# Patient Record
Sex: Female | Born: 1955 | Race: Asian | Hispanic: No | Marital: Married | Smoking: Never smoker
Health system: Southern US, Community
[De-identification: ages and names within clinical notes are randomized; demographics above are authoritative.]

## PROBLEM LIST (undated history)

## (undated) DIAGNOSIS — E119 Type 2 diabetes mellitus without complications: Secondary | ICD-10-CM

## (undated) DIAGNOSIS — I10 Essential (primary) hypertension: Secondary | ICD-10-CM

---

## 2010-07-20 ENCOUNTER — Ambulatory Visit: Admit: 2010-07-20 | Payer: Self-pay | Admitting: Internal Medicine

## 2010-08-01 ENCOUNTER — Inpatient Hospital Stay (INDEPENDENT_AMBULATORY_CARE_PROVIDER_SITE_OTHER)
Admission: RE | Admit: 2010-08-01 | Discharge: 2010-08-01 | Disposition: A | Discharge status: Home / Self Care | Payer: Self-pay | Source: Ambulatory Visit | Attending: Family Medicine | Admitting: Family Medicine

## 2010-08-01 DIAGNOSIS — I10 Essential (primary) hypertension: Secondary | ICD-10-CM

## 2010-08-01 DIAGNOSIS — R51 Headache: Secondary | ICD-10-CM

## 2010-10-18 ENCOUNTER — Inpatient Hospital Stay (INDEPENDENT_AMBULATORY_CARE_PROVIDER_SITE_OTHER)
Admission: RE | Admit: 2010-10-18 | Discharge: 2010-10-18 | Disposition: A | Discharge status: Home / Self Care | Payer: Medicaid Other | Source: Ambulatory Visit | Attending: Family Medicine | Admitting: Family Medicine

## 2010-10-18 DIAGNOSIS — I10 Essential (primary) hypertension: Secondary | ICD-10-CM

## 2010-10-18 LAB — POCT I-STAT, CHEM 8
BUN: 8 mg/dL (ref 6–23)
Chloride: 103 mEq/L (ref 96–112)
Potassium: 4 mEq/L (ref 3.5–5.1)
Sodium: 141 mEq/L (ref 135–145)
TCO2: 29 mmol/L (ref 0–100)

## 2010-12-05 ENCOUNTER — Other Ambulatory Visit (HOSPITAL_COMMUNITY): Payer: Self-pay | Admitting: Family Medicine

## 2010-12-05 DIAGNOSIS — R1011 Right upper quadrant pain: Secondary | ICD-10-CM

## 2010-12-12 ENCOUNTER — Other Ambulatory Visit (HOSPITAL_COMMUNITY): Payer: Medicaid Other

## 2011-03-05 ENCOUNTER — Other Ambulatory Visit (HOSPITAL_COMMUNITY): Payer: Self-pay | Admitting: Family Medicine

## 2011-03-05 DIAGNOSIS — R109 Unspecified abdominal pain: Secondary | ICD-10-CM

## 2011-03-11 ENCOUNTER — Other Ambulatory Visit (HOSPITAL_COMMUNITY): Payer: Self-pay

## 2011-06-04 ENCOUNTER — Emergency Department (INDEPENDENT_AMBULATORY_CARE_PROVIDER_SITE_OTHER)
Admission: EM | Admit: 2011-06-04 | Discharge: 2011-06-04 | Disposition: A | Discharge status: Home / Self Care | Payer: Medicaid Other | Source: Home / Self Care | Attending: Emergency Medicine | Admitting: Emergency Medicine

## 2011-06-04 ENCOUNTER — Encounter: Payer: Self-pay | Admitting: Emergency Medicine

## 2011-06-04 DIAGNOSIS — I10 Essential (primary) hypertension: Secondary | ICD-10-CM

## 2011-06-04 DIAGNOSIS — E876 Hypokalemia: Secondary | ICD-10-CM

## 2011-06-04 DIAGNOSIS — D239 Other benign neoplasm of skin, unspecified: Secondary | ICD-10-CM

## 2011-06-04 HISTORY — DX: Essential (primary) hypertension: I10

## 2011-06-04 LAB — POCT I-STAT, CHEM 8
Creatinine, Ser: 0.7 mg/dL (ref 0.50–1.10)
Hemoglobin: 13.6 g/dL (ref 12.0–15.0)
Potassium: 3.4 mEq/L — ABNORMAL LOW (ref 3.5–5.1)
Sodium: 142 mEq/L (ref 135–145)

## 2011-06-04 MED ORDER — LISINOPRIL-HYDROCHLOROTHIAZIDE 20-12.5 MG PO TABS: 1.0000 | ORAL_TABLET | Freq: Every day | ORAL | Status: DC

## 2011-06-04 MED ORDER — POTASSIUM CHLORIDE ER 10 MEQ PO TBCR: 10.0000 meq | EXTENDED_RELEASE_TABLET | Freq: Two times a day (BID) | ORAL | Status: DC

## 2011-06-04 NOTE — ED Provider Notes (Signed)
History     CSN: [id number] Arrival date & time: 06/04/2011  3:01 PM   First MD Initiated Contact with Patient 06/04/11 1438      Chief Complaint  Patient presents with  . Hypertension    (Consider location/radiation/quality/duration/timing/severity/associated sxs/prior treatment) HPI Comments: The patient speaks very little English and the history is provided by her daughter who speaks a fair amount of [place]. She has had a 16 year history of hypertension, initially treated in [place]. She was on medication there, although she can't provide the name of it. Since she's been here in [place], she goes to the emergency room to get her blood pressure meds refilled. She's been on lisinopril/HCTZ 20/12.5 once a day. She ran out about a week ago and since then has had some headache but no other symptoms. She denies any blurry vision, shortness of breath, chest pain, tightness, pressure, palpitations, dizziness, or syncope. She has had no known history of heart disease, diabetes, elevated cholesterol, stroke, or kidney disease.  Patient is a 55 y.o. female presenting with hypertension.  Hypertension Pertinent negatives include no chest pain, no headaches and no shortness of breath.    Past Medical History  Diagnosis Date  . Hypertension     History reviewed. No pertinent past surgical history.  History reviewed. No pertinent family history.  History  Substance Use Topics  . Smoking status: Never Smoker   . Smokeless tobacco: Not on file  . Alcohol Use: No    OB History    Grav Para Term Preterm Abortions TAB SAB Ect Mult Living                  Review of Systems  Respiratory: Negative for cough, chest tightness, shortness of breath and wheezing.   Cardiovascular: Negative for chest pain, palpitations and leg swelling.  Neurological: Negative for dizziness, weakness, light-headedness, numbness and headaches.    Allergies  Review of patient's allergies indicates no known  allergies.  Home Medications   Current Outpatient Rx  Name Route Sig Dispense Refill  . LISINOPRIL-HYDROCHLOROTHIAZIDE 20-12.5 MG PO TABS Oral Take 1 tablet by mouth daily.      Marland Kitchen LISINOPRIL-HYDROCHLOROTHIAZIDE 20-12.5 MG PO TABS Oral Take 1 tablet by mouth daily. 30 tablet 0  . POTASSIUM CHLORIDE CR 10 MEQ PO TBCR Oral Take 1 tablet (10 mEq total) by mouth 2 (two) times daily. 30 tablet 0    BP 130/90  Pulse 83  Temp(Src) 98.3 F (36.8 C) (Oral)  Resp 20  SpO2 97%  Physical Exam  Nursing note and vitals reviewed. Constitutional: She appears well-developed and well-nourished. No distress.  Neck: No JVD present.  Cardiovascular: Normal rate, regular rhythm, normal heart sounds and intact distal pulses.  Exam reveals no gallop and no friction rub.   No murmur heard. Pulmonary/Chest: Effort normal and breath sounds normal. No respiratory distress. She has no wheezes. She has no rales.  Abdominal: Soft. Bowel sounds are normal. She exhibits no distension, no abdominal bruit, no pulsatile midline mass and no mass. There is no hepatosplenomegaly. There is no tenderness. There is no rebound and no guarding.  Musculoskeletal: She exhibits no edema.  Skin: She is not diaphoretic.    ED Course  Procedures (including critical care time)  Results for orders placed during the hospital encounter of 06/04/11  POCT I-STAT, CHEM 8      Component Value Range   Sodium 142  135 - 145 (mEq/L)   Potassium 3.4 (*) 3.5 - 5.1 (mEq/L)  Chloride 104  96 - 112 (mEq/L)   BUN 7  6 - 23 (mg/dL)   Creatinine, Ser 1.61  0.50 - 1.10 (mg/dL)   Glucose, Bld 096 (*) 70 - 99 (mg/dL)   Calcium, Ion 0.45  4.09 - 1.32 (mmol/L)   TCO2 30  0 - 100 (mmol/L)   Hemoglobin 13.6  12.0 - 15.0 (g/dL)   HCT 81.1  91.4 - 78.2 (%)     Labs Reviewed  POCT I-STAT, CHEM 8 - Abnormal; Notable for the following:    Potassium 3.4 (*)    Glucose, Bld 126 (*)    All other components within normal limits   No results  found.   1. Hypertension   2. Hypokalemia       MDM  Her blood pressure right now is okay, she is a little bit hypokalemic. She was given a refill on her lisinopril/HCTZ and also some potassium chloride. I tried to impress upon her and her daughter the importance of finding a primary care physician that she can go to get refills of her blood pressure medication.        Roque Lias, MD 06/04/11 [phone]

## 2011-06-04 NOTE — ED Notes (Signed)
Patient finished blood pressure medicine, requesting refill, patient has no pcp

## 2011-08-14 ENCOUNTER — Inpatient Hospital Stay (HOSPITAL_COMMUNITY)
Admission: EM | Admit: 2011-08-14 | Discharge: 2011-08-18 | DRG: 390 | Disposition: A | Discharge status: Home / Self Care | Payer: Medicaid Other | Attending: Internal Medicine | Admitting: Internal Medicine

## 2011-08-14 ENCOUNTER — Other Ambulatory Visit: Payer: Self-pay

## 2011-08-14 ENCOUNTER — Emergency Department (HOSPITAL_COMMUNITY)
Admission: EM | Admit: 2011-08-14 | Discharge: 2011-08-14 | Disposition: A | Discharge status: Nursing Home (Includes ICF) | Payer: Medicaid Other | Source: Home / Self Care

## 2011-08-14 ENCOUNTER — Encounter (HOSPITAL_COMMUNITY): Payer: Self-pay | Admitting: *Deleted

## 2011-08-14 ENCOUNTER — Emergency Department (HOSPITAL_COMMUNITY): Payer: Medicaid Other

## 2011-08-14 DIAGNOSIS — E876 Hypokalemia: Secondary | ICD-10-CM | POA: Diagnosis present

## 2011-08-14 DIAGNOSIS — K567 Ileus, unspecified: Secondary | ICD-10-CM | POA: Diagnosis present

## 2011-08-14 DIAGNOSIS — E669 Obesity, unspecified: Secondary | ICD-10-CM | POA: Diagnosis present

## 2011-08-14 DIAGNOSIS — R1013 Epigastric pain: Secondary | ICD-10-CM

## 2011-08-14 DIAGNOSIS — K560 Paralytic ileus: Secondary | ICD-10-CM | POA: Diagnosis present

## 2011-08-14 DIAGNOSIS — K56609 Unspecified intestinal obstruction, unspecified as to partial versus complete obstruction: Secondary | ICD-10-CM

## 2011-08-14 DIAGNOSIS — I10 Essential (primary) hypertension: Secondary | ICD-10-CM

## 2011-08-14 LAB — DIFFERENTIAL
Lymphs Abs: 4.4 10*3/uL — ABNORMAL HIGH (ref 0.7–4.0)
Monocytes Relative: 5 % (ref 3–12)
Neutro Abs: 9.9 10*3/uL — ABNORMAL HIGH (ref 1.7–7.7)
Neutrophils Relative %: 65 % (ref 43–77)

## 2011-08-14 LAB — CBC
Hemoglobin: 13.8 g/dL (ref 12.0–15.0)
MCH: 29 pg (ref 26.0–34.0)
RBC: 4.76 MIL/uL (ref 3.87–5.11)

## 2011-08-14 LAB — COMPREHENSIVE METABOLIC PANEL
Alkaline Phosphatase: 92 U/L (ref 39–117)
BUN: 9 mg/dL (ref 6–23)
CO2: 28 mEq/L (ref 19–32)
Chloride: 102 mEq/L (ref 96–112)
GFR calc Af Amer: >90 mL/min (ref >90)
Glucose, Bld: 116 mg/dL — ABNORMAL HIGH (ref 70–99)
Potassium: 3.4 mEq/L — ABNORMAL LOW (ref 3.5–5.1)
Total Bilirubin: 0.2 mg/dL — ABNORMAL LOW (ref 0.3–1.2)

## 2011-08-14 LAB — LIPASE, BLOOD: Lipase: 35 U/L (ref 11–59)

## 2011-08-14 MED ORDER — IOHEXOL 300 MG/ML  SOLN
40.0000 mL | Freq: Once | INTRAMUSCULAR | Status: AC | PRN
  Administered 2011-08-14: 40 mL via ORAL

## 2011-08-14 MED ORDER — SODIUM CHLORIDE 0.9 % IV BOLUS (SEPSIS)
1000.0000 mL | Freq: Once | INTRAVENOUS | Status: AC
  Administered 2011-08-14: 1000 mL via INTRAVENOUS

## 2011-08-14 MED ORDER — MORPHINE SULFATE 4 MG/ML IJ SOLN
4.0000 mg | Freq: Once | INTRAMUSCULAR | Status: AC
  Administered 2011-08-14: 4 mg via INTRAVENOUS
  Filled 2011-08-14: qty 1

## 2011-08-14 MED ORDER — ONDANSETRON HCL 4 MG/2ML IJ SOLN
4.0000 mg | Freq: Once | INTRAMUSCULAR | Status: AC
  Administered 2011-08-14: 4 mg via INTRAVENOUS
  Filled 2011-08-14: qty 2

## 2011-08-14 MED ORDER — PROMETHAZINE HCL 25 MG/ML IJ SOLN
25.0000 mg | Freq: Once | INTRAMUSCULAR | Status: AC
  Administered 2011-08-14: 25 mg via INTRAVENOUS
  Filled 2011-08-14: qty 1

## 2011-08-14 MED ORDER — IOHEXOL 300 MG/ML  SOLN
100.0000 mL | Freq: Once | INTRAMUSCULAR | Status: AC | PRN
  Administered 2011-08-14: 100 mL via INTRAVENOUS

## 2011-08-14 NOTE — ED Provider Notes (Signed)
Medical screening examination/treatment/procedure(s) were performed by resident physician and as supervising physician I was immediately available for consultation/collaboration.  Richardo Priest, MD  Richardo Priest, MD 08/14/11 2213

## 2011-08-14 NOTE — ED Notes (Signed)
Pt. Transferred to stretcher without incident, pt. Non- English speaking, NAD noted, EDP at the bedside, IV right a/c in place, NS KVO infusing

## 2011-08-14 NOTE — ED Notes (Signed)
Pt drinking contrast for CT scan.

## 2011-08-14 NOTE — ED Provider Notes (Signed)
History     CSN: [id number]  Arrival date & time 08/14/11  1191   None     Chief Complaint  Patient presents with  . Abdominal Pain    (Consider location/radiation/quality/duration/timing/severity/associated sxs/prior treatment) HPI History provided by the patient and the patient's son. The son used as a Nurse, learning disability secondary to patient's language is [place].  History limited secondary to poor historian and mild language barrier.  56 year old female with history of hypertension and from urgent care by EMS secondary to epigastric pain. Patient's pain reportedly began around 3:30 today and is mostly located in the epigastrium. Patient's pain is described as sharp and knifelike, is waxing and waning, described as severe, and although initially stating there is no radiation, patient states that she does have mild pain in her back which appears to be associated. Patient denies associated nausea, vomiting, diarrhea, hematochezia, or urinary symptoms. Patient has not had recent fever sweats or chills.  Patient does report mild worsening of her pain with movement and cannot appreciate any alleviating factors.  The patient's pain did occur soon after eating the uterus.  Patient is not have a history of similar symptoms in the past. Patient has not had abdominal surgeries in the past.  Patient was seen prior to ED arrival at urgent care where an IV was placed but no labs or further evaluation was completed   Past Medical History  Diagnosis Date  . Hypertension     No past surgical history on file.  No family history on file.  History  Substance Use Topics  . Smoking status: Never Smoker   . Smokeless tobacco: Not on file  . Alcohol Use: No    OB History    Grav Para Term Preterm Abortions TAB SAB Ect Mult Living                  Review of Systems  Constitutional: Negative for fever and chills.  HENT: Negative for congestion, sore throat and rhinorrhea.   Eyes: Negative for pain  and visual disturbance.  Respiratory: Negative for cough, shortness of breath and wheezing.   Cardiovascular: Negative for chest pain and palpitations.  Gastrointestinal: Positive for abdominal pain. Negative for nausea, vomiting, diarrhea and blood in stool.  Genitourinary: Negative for dysuria and hematuria.  Musculoskeletal: Positive for back pain. Negative for gait problem.  Skin: Negative for rash and wound.  Neurological: Negative for dizziness and headaches.  Psychiatric/Behavioral: Negative for confusion and agitation.  All other systems reviewed and are negative.    Allergies  Review of patient's allergies indicates no known allergies.  Home Medications   Current Outpatient Rx  Name Route Sig Dispense Refill  . LISINOPRIL-HYDROCHLOROTHIAZIDE 20-12.5 MG PO TABS Oral Take 1 tablet by mouth daily.      Marland Kitchen LISINOPRIL-HYDROCHLOROTHIAZIDE 20-12.5 MG PO TABS Oral Take 1 tablet by mouth daily. 30 tablet 0  . POTASSIUM CHLORIDE ER 10 MEQ PO TBCR Oral Take 1 tablet (10 mEq total) by mouth 2 (two) times daily. 30 tablet 0    BP 127/91  Pulse 101  Temp(Src) 98.4 F (36.9 C) (Oral)  Resp 16  SpO2 95%  Physical Exam  Nursing note and vitals reviewed. Constitutional: She is oriented to person, place, and time.       Appears uncomfortable but NAD, mildly obese, alert, son arriving soon after ED bedding  HENT:  Head: Normocephalic and atraumatic.  Right Ear: External ear normal.  Left Ear: External ear normal.  Nose: Nose normal.  Mouth/Throat: Oropharynx is clear and moist.  Eyes: Conjunctivae and EOM are normal. Pupils are equal, round, and reactive to light.  Neck: Normal range of motion. Neck supple.  Cardiovascular: Regular rhythm and intact distal pulses.  Tachycardia present.   No murmur heard. Pulmonary/Chest: Effort normal and breath sounds normal. No respiratory distress. She has no wheezes. She has no rales. She exhibits no tenderness.  Abdominal: Soft. Bowel sounds  are normal. There is tenderness (diffusely but greater in the epigastrium). There is no rebound and no guarding.       Obese, neg murphy's Sn, positive bilateral CVA TTP  Musculoskeletal: Normal range of motion. She exhibits no edema.  Neurological: She is alert and oriented to person, place, and time.  Skin: Skin is warm and dry. No rash noted. She is not diaphoretic.  Psychiatric: She has a normal mood and affect. Judgment normal.    ED Course  Procedures (including critical care time)   Date: 08/14/2011  Rate: 99  Rhythm: normal sinus rhythm  QRS Axis: normal  Intervals: normal  ST/T Wave abnormalities: normal  Conduction Disutrbances: none Early R progression No prior.    Labs Reviewed  CBC - Abnormal; Notable for the following:    WBC 15.2 (*)    All other components within normal limits  DIFFERENTIAL - Abnormal; Notable for the following:    Neutro Abs 9.9 (*)    Lymphs Abs 4.4 (*)    All other components within normal limits  COMPREHENSIVE METABOLIC PANEL - Abnormal; Notable for the following:    Potassium 3.4 (*)    Glucose, Bld 116 (*)    Total Bilirubin 0.2 (*)    All other components within normal limits  LIPASE, BLOOD  LACTIC ACID, PLASMA   Ct Abdomen Pelvis W Contrast  08/14/2011  *RADIOLOGY REPORT*  Clinical Data: Mid to lower abdominal pain.  Nausea and vomiting. Fever.  CT ABDOMEN AND PELVIS WITH CONTRAST  Technique:  Multidetector CT imaging of the abdomen and pelvis was performed following the standard protocol during bolus administration of intravenous contrast.  Contrast: OMNIPAQUE IOHEXOL 300 MG/ML IV SOLN  Comparison: None.  Findings: Mild diffuse low density of the liver relative to the spleen. Small cyst in the posterior aspect of the dome of the liver.  Additional small cyst or a nonspecific area of low density in the anterior aspect of the upper liver on the right, measuring 4 mm in maximum diameter on image #9.  Right iliac bone cyst.  Mildly  dilated, fluid-filled small bowel loops in the lower abdomen and pelvis with normal caliber proximal and distal loops.  Minimal free peritoneal fluid in the pelvic cul-de-sac.  No bowel wall thickening or soft tissue stranding.  No enlarged lymph nodes.  Unremarkable spleen, pancreas, gallbladder, adrenal glands, kidneys, urinary bladder, uterus and ovaries.  Normal appearing appendix in the right upper pelvis.  Clear lung bases.  Mild lower thoracic spine degenerative changes.  IMPRESSION:  1.  Mildly dilated, fluid-filled small bowel loops in the lower abdomen and pelvis.  Differential considerations include ileus and mild partial obstruction. 2.  Small amount of probable physiological fluid in the pelvic cul- de-sac. 3.  Mild hepatic steatosis.  Original Report Authenticated By: Darrol Angel, M.D.     1. Epigastric abdominal pain   2. Ileus       MDM  56 year old female with complaint of acute epigastric pain which is waxing and waning worse with movement.  Exam as above, borderline  tachycardia, diffuse abdominal tenderness to palpation greater in the epigastrium. Labs, morphine, Zofran, and CT abdomen ordered.  Patient vomiting x 4 during ED course, possible 2/2 abd path vs. Morphine.  CT concerning for distended small bowel without transition point concerning for ileus versus partial SBO.  Patient without history of prior surgeries and no radiographic evidence of tumor or hernia; thus, ileus most likely.  [facility] consult and will admit the patient. Discussed case with surgery who recommends n.p.o., IVF resuscitation, anti-emetics, and no NG tube unless with persistent vomiting.    Patient admitted to [facility].  Particia Lather, MD 08/15/11 [phone]

## 2011-08-14 NOTE — ED Notes (Signed)
Pt vomiting, EDP resident notified and Zofran ordered.  Family remains at bedside.

## 2011-08-14 NOTE — ED Notes (Signed)
Pt  Reports  abd  Pain   For  One  Hour         Nausea    -  No  Vomiting  No  Diarrhea    -  Normal  bm  Yesterday      -            No  Medical  Problems             Other  Than  hypertension

## 2011-08-14 NOTE — ED Notes (Signed)
PT  MOANING  AT  INTERVALS   WITH  SEVERE  EPIGASTRIC  PAIN           SON  AT  BEDSIDE    SKIN IS  WARM  /  DRY     -  PT  IS  Npo   At  This  Time

## 2011-08-14 NOTE — ED Provider Notes (Signed)
[name] [name] is a 56 y.o. female who presents to Urgent Care today for abdominal pain. Her son is able to provide translation. She reports that she had abdominal pain starting about one hour ago that was severe in intensity immediately. She last had something to eat at noon today. She was in usual state of health until one hour prior to presentation. Describes mostly epigastric pain that waxes and wanes in intensity. Denies any nausea, vomiting, diarrhea. She did have a normal bowel movement yesterday. No fevers or chills. Pain is 10 out of 10. She denies any history of abdominal surgery. She has never had any abdominal pain like this before the past.   PMH reviewed.  ROS as above otherwise neg Medications reviewed. No current facility-administered medications for this encounter.   Current Outpatient Prescriptions  Medication Sig Dispense Refill  . lisinopril-hydrochlorothiazide (PRINZIDE,ZESTORETIC) 20-12.5 MG per tablet Take 1 tablet by mouth daily.        Marland Kitchen lisinopril-hydrochlorothiazide (ZESTORETIC) 20-12.5 MG per tablet Take 1 tablet by mouth daily.  30 tablet  0  . potassium chloride (K-DUR) 10 MEQ tablet Take 1 tablet (10 mEq total) by mouth 2 (two) times daily.  30 tablet  0    Exam:  BP 154/101  Pulse 108  Temp(Src) 97.7 F (36.5 C) (Oral)  Resp 22  SpO2 100% Gen: Patient lying on hospital bed in moderate distress, moaning in pain. HEENT: EOMI,  MMM Lungs: CTABL Nl WOB Heart: RRR no MRG Abd: Soft. Distended and tender throughout abdomen but point of maximal tenderness is epigastric area. Hypoactive bowel sounds. Guarding present.  No rebound tenderness Exts: Non edematous BL  LE, warm and well perfused.   Assessment and Plan:  1.  Abdominal pain: Plan to transfer patient directly to emergency room via [facility]. Concern is that she has acute abdomen. She is tender throughout her entire abdomen and had immediate abdominal pain at maximum intensity one hour prior to  presentation. She is writhing on hospital bed. She has hypoactive bowel sounds. Differential includes acute cholecystitis, pancreatitis, referred pain from other abdominal process.  Renold Don, MD 08/14/11 [phone]

## 2011-08-14 NOTE — H&P (Signed)
[name] [name] is an 56 y.o. female.   PCP - [facility]. Obtained history using an interpreter as patient speaks only [place]. Chief Complaint: Abdominal pain with nausea and vomiting. HPI: 56 year-old female with history of hypertension and no previous surgeries presented to the ER because of persistent abdominal pain. The pain is localized around the epigastric area has been constant. The pain started off suddenly around 4 PM this evening. Once patient came to the ER patient had thrown up twice there was no blood in it. The last time she had a bowel movement was today morning. It was normal and did not have any diarrhea. Patient has not had similar symptoms before. CAT scan of the abdomen and pelvis done shows small bowel obstruction versus ileus. Surgery was consulted by the ER physician at this time hospitalist will be admitting for further management. Patient denies any chest pain shorter breath fever chills cough or phlegm dizziness or any focal deficits.  Past Medical History  Diagnosis Date  . Hypertension     History reviewed. No pertinent past surgical history.  History reviewed. No pertinent family history. Social History:  reports that she has never smoked. She does not have any smokeless tobacco history on file. She reports that she does not drink alcohol or use illicit drugs.  Allergies: No Known Allergies  Medications Prior to Admission  Medication Dose Route Frequency Provider Last Rate Last Dose  . iohexol (OMNIPAQUE) 300 MG/ML solution 100 mL  100 mL Intravenous Once PRN Medication Radiologist, MD   100 mL at 08/14/11 2121  . iohexol (OMNIPAQUE) 300 MG/ML solution 40 mL  40 mL Oral Once PRN Medication Radiologist, MD   40 mL at 08/14/11 2121  . morphine 4 MG/ML injection 4 mg  4 mg Intravenous Once Particia Lather, MD   4 mg at 08/14/11 1944  . morphine 4 MG/ML injection 4 mg  4 mg Intravenous Once Particia Lather, MD   4 mg at 08/14/11 2116  . ondansetron (ZOFRAN) injection 4 mg  4 mg  Intravenous Once Particia Lather, MD   4 mg at 08/14/11 1942  . ondansetron (ZOFRAN) injection 4 mg  4 mg Intravenous Once Particia Lather, MD   4 mg at 08/14/11 2116  . ondansetron (ZOFRAN) injection 4 mg  4 mg Intravenous Once Particia Lather, MD   4 mg at 08/14/11 2222  . promethazine (PHENERGAN) injection 25 mg  25 mg Intravenous Once Loren Racer, MD      . sodium chloride 0.9 % bolus 1,000 mL  1,000 mL Intravenous Once Particia Lather, MD   1,000 mL at 08/14/11 1944   Medications Prior to Admission  Medication Sig Dispense Refill  . lisinopril-hydrochlorothiazide (PRINZIDE,ZESTORETIC) 20-12.5 MG per tablet Take 1 tablet by mouth every morning.         Results for orders placed during the hospital encounter of 08/14/11 (from the past 48 hour(s))  CBC     Status: Abnormal   Collection Time   08/14/11  7:35 PM      Component Value Range Comment   WBC 15.2 (*) 4.0 - 10.5 (K/uL)    RBC 4.76  3.87 - 5.11 (MIL/uL)    Hemoglobin 13.8  12.0 - 15.0 (g/dL)    HCT 16.1  09.6 - 04.5 (%)    MCV 84.2  78.0 - 100.0 (fL)    MCH 29.0  26.0 - 34.0 (pg)    MCHC 34.4  30.0 - 36.0 (g/dL)    RDW 12.7  11.5 - 15.5 (%)    Platelets 282  150 - 400 (K/uL)   DIFFERENTIAL     Status: Abnormal   Collection Time   08/14/11  7:35 PM      Component Value Range Comment   Neutrophils Relative 65  43 - 77 (%)    Neutro Abs 9.9 (*) 1.7 - 7.7 (K/uL)    Lymphocytes Relative 29  12 - 46 (%)    Lymphs Abs 4.4 (*) 0.7 - 4.0 (K/uL)    Monocytes Relative 5  3 - 12 (%)    Monocytes Absolute 0.8  0.1 - 1.0 (K/uL)    Eosinophils Relative 1  0 - 5 (%)    Eosinophils Absolute 0.2  0.0 - 0.7 (K/uL)    Basophils Relative 0  0 - 1 (%)    Basophils Absolute 0.1  0.0 - 0.1 (K/uL)   COMPREHENSIVE METABOLIC PANEL     Status: Abnormal   Collection Time   08/14/11  7:35 PM      Component Value Range Comment   Sodium 140  135 - 145 (mEq/L)    Potassium 3.4 (*) 3.5 - 5.1 (mEq/L)    Chloride 102  96 - 112 (mEq/L)    CO2 28  19 - 32 (mEq/L)     Glucose, Bld 116 (*) 70 - 99 (mg/dL)    BUN 9  6 - 23 (mg/dL)    Creatinine, Ser 1.61  0.50 - 1.10 (mg/dL)    Calcium 9.8  8.4 - 10.5 (mg/dL)    Total Protein 7.7  6.0 - 8.3 (g/dL)    Albumin 3.9  3.5 - 5.2 (g/dL)    AST 28  0 - 37 (U/L)    ALT 25  0 - 35 (U/L)    Alkaline Phosphatase 92  39 - 117 (U/L)    Total Bilirubin 0.2 (*) 0.3 - 1.2 (mg/dL)    GFR calc non Af Amer >90  >90 (mL/min)    GFR calc Af Amer >90  >90 (mL/min)   LIPASE, BLOOD     Status: Normal   Collection Time   08/14/11  7:35 PM      Component Value Range Comment   Lipase 35  11 - 59 (U/L)   LACTIC ACID, PLASMA     Status: Normal   Collection Time   08/14/11  7:36 PM      Component Value Range Comment   Lactic Acid, Venous 2.0  0.5 - 2.2 (mmol/L)    Ct Abdomen Pelvis W Contrast  08/14/2011  *RADIOLOGY REPORT*  Clinical Data: Mid to lower abdominal pain.  Nausea and vomiting. Fever.  CT ABDOMEN AND PELVIS WITH CONTRAST  Technique:  Multidetector CT imaging of the abdomen and pelvis was performed following the standard protocol during bolus administration of intravenous contrast.  Contrast: OMNIPAQUE IOHEXOL 300 MG/ML IV SOLN  Comparison: None.  Findings: Mild diffuse low density of the liver relative to the spleen. Small cyst in the posterior aspect of the dome of the liver.  Additional small cyst or a nonspecific area of low density in the anterior aspect of the upper liver on the right, measuring 4 mm in maximum diameter on image #9.  Right iliac bone cyst.  Mildly dilated, fluid-filled small bowel loops in the lower abdomen and pelvis with normal caliber proximal and distal loops.  Minimal free peritoneal fluid in the pelvic cul-de-sac.  No bowel wall thickening or soft tissue stranding.  No enlarged lymph nodes.  Unremarkable spleen, pancreas, gallbladder, adrenal glands, kidneys, urinary bladder, uterus and ovaries.  Normal appearing appendix in the right upper pelvis.  Clear lung bases.  Mild lower thoracic spine  degenerative changes.  IMPRESSION:  1.  Mildly dilated, fluid-filled small bowel loops in the lower abdomen and pelvis.  Differential considerations include ileus and mild partial obstruction. 2.  Small amount of probable physiological fluid in the pelvic cul- de-sac. 3.  Mild hepatic steatosis.  Original Report Authenticated By: Darrol Angel, M.D.    Review of Systems  Constitutional: Negative.   HENT: Negative.   Eyes: Negative.   Respiratory: Negative.   Cardiovascular: Negative.   Gastrointestinal: Positive for nausea, vomiting and abdominal pain.  Genitourinary: Negative.   Musculoskeletal: Negative.   Skin: Negative.   Neurological: Negative.   Endo/Heme/Allergies: Negative.   Psychiatric/Behavioral: Negative.     Blood pressure 139/92, pulse 101, temperature 98 F (36.7 C), temperature source Oral, resp. rate 18, SpO2 95.00%. Physical Exam  Constitutional: She is oriented to person, place, and time. She appears well-developed and well-nourished. No distress.  HENT:  Head: Normocephalic and atraumatic.  Right Ear: External ear normal.  Left Ear: External ear normal.  Nose: Nose normal.  Mouth/Throat: Oropharynx is clear and moist. No oropharyngeal exudate.  Eyes: Conjunctivae are normal. Pupils are equal, round, and reactive to light. Right eye exhibits no discharge. Left eye exhibits no discharge. No scleral icterus.  Neck: Normal range of motion. Neck supple.  Cardiovascular:       Sinus tachycardia.  Respiratory: Effort normal and breath sounds normal. No respiratory distress. She has no wheezes. She has no rales.  GI:       Abdomen is mildly distended and I do not appreciate any bowel sounds. Patient has no tenderness guarding or rigidity.  Musculoskeletal: Normal range of motion. She exhibits no edema and no tenderness.  Neurological: She is alert and oriented to person, place, and time.       Moves upper and lower extremities 5/5.   Skin: Skin is warm and dry. No  rash noted. She is not diaphoretic. No erythema.  Psychiatric: Her behavior is normal.     Assessment/Plan #1. Abdominal pain with nausea vomiting most likely from partial small bowel traction versus ileus - at this time patient will be kept n.p.o. and IV fluids. Replace potassium. Pain relief medications. If patient further vomits will place NG tube. If the symptoms does not get better we'll have a formal surgical consult. #2. History of hypertension - as patient is n.p.o. we will keep patient on when necessary IV labetalol.  CODE STATUS - full code.  Winferd Wease N. 08/14/2011, 11:11 PM

## 2011-08-14 NOTE — ED Notes (Signed)
Pt. Received from Urgent Care via [facility], pt. Alert and non English speaking, NAD, chief complaint abd. pain

## 2011-08-15 ENCOUNTER — Encounter (HOSPITAL_COMMUNITY): Payer: Self-pay | Admitting: *Deleted

## 2011-08-15 DIAGNOSIS — R109 Unspecified abdominal pain: Secondary | ICD-10-CM

## 2011-08-15 LAB — COMPREHENSIVE METABOLIC PANEL
ALT: 20 U/L (ref 0–35)
Alkaline Phosphatase: 70 U/L (ref 39–117)
BUN: 10 mg/dL (ref 6–23)
Chloride: 107 mEq/L (ref 96–112)
GFR calc Af Amer: >90 mL/min (ref >90)
Glucose, Bld: 113 mg/dL — ABNORMAL HIGH (ref 70–99)
Potassium: 3.8 mEq/L (ref 3.5–5.1)
Sodium: 140 mEq/L (ref 135–145)
Total Bilirubin: 0.3 mg/dL (ref 0.3–1.2)

## 2011-08-15 LAB — CBC
MCHC: 33.4 g/dL (ref 30.0–36.0)
MCV: 85.7 fL (ref 78.0–100.0)
Platelets: 235 10*3/uL (ref 150–400)
RDW: 12.8 % (ref 11.5–15.5)
WBC: 10.1 10*3/uL (ref 4.0–10.5)

## 2011-08-15 LAB — GLUCOSE, CAPILLARY
Glucose-Capillary: 113 mg/dL — ABNORMAL HIGH (ref 70–99)
Glucose-Capillary: 97 mg/dL (ref 70–99)

## 2011-08-15 MED ORDER — ACETAMINOPHEN 650 MG RE SUPP: 650.0000 mg | Freq: Four times a day (QID) | RECTAL | Status: DC | PRN

## 2011-08-15 MED ORDER — ONDANSETRON HCL 4 MG PO TABS: 4.0000 mg | ORAL_TABLET | Freq: Four times a day (QID) | ORAL | Status: DC | PRN

## 2011-08-15 MED ORDER — ONDANSETRON HCL 4 MG/2ML IJ SOLN: 4.0000 mg | Freq: Four times a day (QID) | INTRAMUSCULAR | Status: DC | PRN

## 2011-08-15 MED ORDER — MORPHINE SULFATE 2 MG/ML IJ SOLN: 0.5000 mg | INTRAMUSCULAR | Status: DC | PRN

## 2011-08-15 MED ORDER — LABETALOL HCL 5 MG/ML IV SOLN
10.0000 mg | INTRAVENOUS | Status: DC | PRN
  Administered 2011-08-18: 10 mg via INTRAVENOUS
  Filled 2011-08-15 (×2): qty 4

## 2011-08-15 MED ORDER — ACETAMINOPHEN 325 MG PO TABS
650.0000 mg | ORAL_TABLET | Freq: Four times a day (QID) | ORAL | Status: DC | PRN
  Administered 2011-08-17 – 2011-08-18 (×2): 650 mg via ORAL
  Filled 2011-08-15 (×2): qty 2

## 2011-08-15 MED ORDER — KCL IN DEXTROSE-NACL 20-5-0.9 MEQ/L-%-% IV SOLN
INTRAVENOUS | Status: DC
  Administered 2011-08-15 – 2011-08-17 (×4): via INTRAVENOUS
  Filled 2011-08-15 (×8): qty 1000

## 2011-08-15 MED ORDER — PANTOPRAZOLE SODIUM 40 MG IV SOLR
40.0000 mg | INTRAVENOUS | Status: DC
  Administered 2011-08-15 – 2011-08-17 (×4): 40 mg via INTRAVENOUS
  Filled 2011-08-15 (×4): qty 40

## 2011-08-15 MED ORDER — SODIUM CHLORIDE 0.9 % IJ SOLN
3.0000 mL | Freq: Two times a day (BID) | INTRAMUSCULAR | Status: DC
  Administered 2011-08-15 – 2011-08-18 (×5): 3 mL via INTRAVENOUS

## 2011-08-15 NOTE — Progress Notes (Signed)
   CARE MANAGEMENT NOTE 08/15/2011  Patient:  [name] [name]   Account Number:  [id number]  Date Initiated:  08/15/2011  Documentation initiated by:  Donn Pierini  Subjective/Objective Assessment:   Pt admitted with SBO     Action/Plan:   PTA pt lived at home with spouse, was independent with ADLs, does not speak english- speaks [place]- plan to return home with family   Anticipated DC Date:  08/19/2011   Anticipated DC Plan:  HOME/SELF CARE      DC Planning Services  CM consult      Choice offered to / List presented to:             Status of service:  In process, will continue to follow Medicare Important Message given?   (If response is "NO", the following Medicare IM given date fields will be blank) Date Medicare IM given:   Date Additional Medicare IM given:    Discharge Disposition:    Per UR Regulation:    Comments:  PCP- [facility]  08/15/11- 1615- Donn Pierini RN, BSN [phone] CM to follow for d/c needs.

## 2011-08-15 NOTE — Progress Notes (Signed)
Utilization review complete 

## 2011-08-15 NOTE — ED Notes (Signed)
Dr. Lindie Spruce at bedside to speak with pt

## 2011-08-15 NOTE — Progress Notes (Signed)
Subjective: Pt speaks no English, Daughter in law speaks very little English, Pt still having abdominal discomfort, no nausea or vomiting I can discern.   Objective: Vital signs in last 24 hours: Temp:  [97.7 F (36.5 C)-98.8 F (37.1 C)] 98.8 F (37.1 C) (02/21 0654) Pulse Rate:  [70-108] 70  (02/21 0654) Resp:  [10-22] 15  (02/21 0654) BP: (90-154)/(55-101) 100/71 mmHg (02/21 0654) SpO2:  [94 %-100 %] 100 % (02/21 0654) Weight:  [55.3 kg (121 lb 14.6 oz)] 55.3 kg (121 lb 14.6 oz) (02/21 0124) Last BM Date: 08/14/11  Intake/Output from previous day: 02/20 0701 - 02/21 0700 In: 328 [I.V.:328] Out: -  Intake/Output this shift:    PE: Alert, still uncomfortable, Not distended, no guarding or rebound, no point tenderness. Last BM yesterdayAM.  Seen by DR. Wyatt [phone].   Lab Results:   Basename 08/15/11 0305 08/14/11 1935  WBC 10.1 15.2*  HGB 11.4* 13.8  HCT 34.1* 40.1  PLT 235 282    BMET  Basename 08/15/11 0305 08/14/11 1935  NA 140 140  K 3.8 3.4*  CL 107 102  CO2 28 28  GLUCOSE 113* 116*  BUN 10 9  CREATININE 0.66 0.63  CALCIUM 8.2* 9.8   PT/INR No results found for this basename: LABPROT:2,INR:2 in the last 72 hours   Lab 08/15/11 0305 08/14/11 1935  AST 21 28  ALT 20 25  ALKPHOS 70 92  BILITOT 0.3 0.2*  PROT 6.5 7.7  ALBUMIN 3.2* 3.9    Studies/Results: Ct Abdomen Pelvis W Contrast  08/14/2011  *RADIOLOGY REPORT*  Clinical Data: Mid to lower abdominal pain.  Nausea and vomiting. Fever.  CT ABDOMEN AND PELVIS WITH CONTRAST  Technique:  Multidetector CT imaging of the abdomen and pelvis was performed following the standard protocol during bolus administration of intravenous contrast.  Contrast: OMNIPAQUE IOHEXOL 300 MG/ML IV SOLN  Comparison: None.  Findings: Mild diffuse low density of the liver relative to the spleen. Small cyst in the posterior aspect of the dome of the liver.  Additional small cyst or a nonspecific area of low density in the  anterior aspect of the upper liver on the right, measuring 4 mm in maximum diameter on image #9.  Right iliac bone cyst.  Mildly dilated, fluid-filled small bowel loops in the lower abdomen and pelvis with normal caliber proximal and distal loops.  Minimal free peritoneal fluid in the pelvic cul-de-sac.  No bowel wall thickening or soft tissue stranding.  No enlarged lymph nodes.  Unremarkable spleen, pancreas, gallbladder, adrenal glands, kidneys, urinary bladder, uterus and ovaries.  Normal appearing appendix in the right upper pelvis.  Clear lung bases.  Mild lower thoracic spine degenerative changes.  IMPRESSION:  1.  Mildly dilated, fluid-filled small bowel loops in the lower abdomen and pelvis.  Differential considerations include ileus and mild partial obstruction. 2.  Small amount of probable physiological fluid in the pelvic cul- de-sac. 3.  Mild hepatic steatosis.  Original Report Authenticated By: Darrol Angel, M.D.    Anti-infectives: Anti-infectives    None     Current Facility-Administered Medications  Medication Dose Route Frequency Provider Last Rate Last Dose  . acetaminophen (TYLENOL) tablet 650 mg  650 mg Oral Q6H PRN Eduard Clos, MD       Or  . acetaminophen (TYLENOL) suppository 650 mg  650 mg Rectal Q6H PRN Eduard Clos, MD      . dextrose 5 % and 0.9 % NaCl with KCl  20 mEq/L infusion   Intravenous Continuous Eduard Clos, MD 100 mL/hr at 08/15/11 0342    . iohexol (OMNIPAQUE) 300 MG/ML solution 100 mL  100 mL Intravenous Once PRN Medication Radiologist, MD   100 mL at 08/14/11 2121  . iohexol (OMNIPAQUE) 300 MG/ML solution 40 mL  40 mL Oral Once PRN Medication Radiologist, MD   40 mL at 08/14/11 2121  . labetalol (NORMODYNE,TRANDATE) injection 10 mg  10 mg Intravenous Q2H PRN Eduard Clos, MD      . morphine 2 MG/ML injection 0.5 mg  0.5 mg Intravenous Q3H PRN Eduard Clos, MD      . morphine 4 MG/ML injection 4 mg  4 mg Intravenous  Once Particia Lather, MD   4 mg at 08/14/11 1944  . morphine 4 MG/ML injection 4 mg  4 mg Intravenous Once Particia Lather, MD   4 mg at 08/14/11 2116  . ondansetron (ZOFRAN) injection 4 mg  4 mg Intravenous Once Particia Lather, MD   4 mg at 08/14/11 1942  . ondansetron (ZOFRAN) injection 4 mg  4 mg Intravenous Once Particia Lather, MD   4 mg at 08/14/11 2116  . ondansetron (ZOFRAN) injection 4 mg  4 mg Intravenous Once Particia Lather, MD   4 mg at 08/14/11 2222  . ondansetron (ZOFRAN) tablet 4 mg  4 mg Oral Q6H PRN Eduard Clos, MD       Or  . ondansetron [facility]) injection 4 mg  4 mg Intravenous Q6H PRN Eduard Clos, MD      . pantoprazole (PROTONIX) injection 40 mg  40 mg Intravenous Q24H Eduard Clos, MD      . promethazine (PHENERGAN) injection 25 mg  25 mg Intravenous Once Loren Racer, MD   25 mg at 08/14/11 2351  . sodium chloride 0.9 % bolus 1,000 mL  1,000 mL Intravenous Once Particia Lather, MD   1,000 mL at 08/14/11 1944  . sodium chloride 0.9 % injection 3 mL  3 mL Intravenous Q12H Eduard Clos, MD   3 mL at 08/15/11 0344    Assessment/Plan Abdominal pain with nausea vomiting most likely from partial small bowel traction versus ileus  Hypertension   Plan: NPO, hydrate, recheck film and labs AM  LOS: 1 day    [name] [name] 08/15/2011

## 2011-08-15 NOTE — Progress Notes (Signed)
Subjective: Patient and daughter in room.  Attempted to use translator but left on hold.  + flatus today while sleeping, last BM yesterday No nausea, no vomiting, wanting to eat  Objective: Vital signs in last 24 hours: Filed Vitals:   08/15/11 0056 08/15/11 0124 08/15/11 0654 08/15/11 1013  BP: 98/84 111/73 100/71 121/79  Pulse:  91 70 86  Temp:  98.2 F (36.8 C) 98.8 F (37.1 C) 98.9 F (37.2 C)  TempSrc:  Oral Oral Oral  Resp:  15 15 18   Height:  5' (1.524 m)    Weight:  55.3 kg (121 lb 14.6 oz)    SpO2:  98% 100% 99%   Weight change:   Intake/Output Summary (Last 24 hours) at 08/15/11 1158 Last data filed at 08/15/11 0600  Gross per 24 hour  Intake    328 ml  Output      0 ml  Net    328 ml    Physical Exam: General: Awake, Oriented, No acute distress. HEENT: EOMI. Neck: Supple, no JVD CV: S1 and S2, rrr Lungs: Clear to ascultation bilaterally, no wheezing Abdomen: Soft, Nontender, Nondistended, +bowel sounds- upper > lower abd, not high pitched obese,. Ext: Good pulses. Trace edema.   Lab Results:  [facility] 08/15/11 0305 08/14/11 1935  NA 140 140  K 3.8 3.4*  CL 107 102  CO2 28 28  GLUCOSE 113* 116*  BUN 10 9  CREATININE 0.66 0.63  CALCIUM 8.2* 9.8  MG -- --  PHOS -- --    Basename 08/15/11 0305 08/14/11 1935  AST 21 28  ALT 20 25  ALKPHOS 70 92  BILITOT 0.3 0.2*  PROT 6.5 7.7  ALBUMIN 3.2* 3.9    Basename 08/14/11 1935  LIPASE 35  AMYLASE --    Basename 08/15/11 0305 08/14/11 1935  WBC 10.1 15.2*  NEUTROABS -- 9.9*  HGB 11.4* 13.8  HCT 34.1* 40.1  MCV 85.7 84.2  PLT 235 282    Basename 08/15/11 0305  CKTOTAL 70  CKMB 2.0  CKMBINDEX --  TROPONINI <0.30   No components found with this basename: POCBNP:3 No results found for this basename: DDIMER:2 in the last 72 hours No results found for this basename: HGBA1C:2 in the last 72 hours No results found for this basename: CHOL:2,HDL:2,LDLCALC:2,TRIG:2,CHOLHDL:2,LDLDIRECT:2 in the  last 72 hours  Basename 08/15/11 0305  TSH 1.140  T4TOTAL --  T3FREE --  THYROIDAB --   No results found for this basename: VITAMINB12:2,FOLATE:2,FERRITIN:2,TIBC:2,IRON:2,RETICCTPCT:2 in the last 72 hours  Micro Results: No results found for this or any previous visit (from the past 240 hour(s)).  Studies/Results: Ct Abdomen Pelvis W Contrast  08/14/2011  *RADIOLOGY REPORT*  Clinical Data: Mid to lower abdominal pain.  Nausea and vomiting. Fever.  CT ABDOMEN AND PELVIS WITH CONTRAST  Technique:  Multidetector CT imaging of the abdomen and pelvis was performed following the standard protocol during bolus administration of intravenous contrast.  Contrast: OMNIPAQUE IOHEXOL 300 MG/ML IV SOLN  Comparison: None.  Findings: Mild diffuse low density of the liver relative to the spleen. Small cyst in the posterior aspect of the dome of the liver.  Additional small cyst or a nonspecific area of low density in the anterior aspect of the upper liver on the right, measuring 4 mm in maximum diameter on image #9.  Right iliac bone cyst.  Mildly dilated, fluid-filled small bowel loops in the lower abdomen and pelvis with normal caliber proximal and distal loops.  Minimal free peritoneal  fluid in the pelvic cul-de-sac.  No bowel wall thickening or soft tissue stranding.  No enlarged lymph nodes.  Unremarkable spleen, pancreas, gallbladder, adrenal glands, kidneys, urinary bladder, uterus and ovaries.  Normal appearing appendix in the right upper pelvis.  Clear lung bases.  Mild lower thoracic spine degenerative changes.  IMPRESSION:  1.  Mildly dilated, fluid-filled small bowel loops in the lower abdomen and pelvis.  Differential considerations include ileus and mild partial obstruction. 2.  Small amount of probable physiological fluid in the pelvic cul- de-sac. 3.  Mild hepatic steatosis.  Original Report Authenticated By: Darrol Angel, M.D.    Medications: I have reviewed the patient's current  medications. Scheduled Meds:   .  morphine injection  4 mg Intravenous Once  .  morphine injection  4 mg Intravenous Once  . ondansetron (ZOFRAN) IV  4 mg Intravenous Once  . ondansetron (ZOFRAN) IV  4 mg Intravenous Once  . ondansetron (ZOFRAN) IV  4 mg Intravenous Once  . pantoprazole (PROTONIX) IV  40 mg Intravenous Q24H  . promethazine  25 mg Intravenous Once  . sodium chloride  1,000 mL Intravenous Once  . sodium chloride  3 mL Intravenous Q12H   Continuous Infusions:   . dextrose 5 % and 0.9 % NaCl with KCl 20 mEq/L 100 mL/hr at 08/15/11 0342   PRN Meds:.acetaminophen, acetaminophen, iohexol, iohexol, labetalol, morphine, ondansetron (ZOFRAN) IV, ondansetron  Assessment/Plan:   *SBO (small bowel obstruction)- NPO, no NG tube, appreciate surgery following, ice chips ok for now, labs in AM and x-ray.  Patient is passing gas and has bowel sounds   HTN (hypertension)- stable  Hypokalemia- repleated  Leukocytosis- resolved    LOS: 1 day  Ivyanna Sibert, DO 08/15/2011, 11:58 AM

## 2011-08-15 NOTE — ED Notes (Signed)
Attempted to call report x 1.  Given name and number and nurse to call back.  

## 2011-08-15 NOTE — Consult Note (Signed)
  GS Progress Note Subjective: ? SBO on CT, no prior surgical history.  Does not speak English, vomited in the ED  Objective: Vital signs in last 24 hours: Temp:  [97.7 F (36.5 C)-98.4 F (36.9 C)] 98.2 F (36.8 C) (02/21 0124) Pulse Rate:  [90-108] 91  (02/21 0124) Resp:  [10-22] 15  (02/21 0124) BP: (90-154)/(55-101) 111/73 mmHg (02/21 0124) SpO2:  [94 %-100 %] 98 % (02/21 0124) Weight:  [55.3 kg (121 lb 14.6 oz)] 55.3 kg (121 lb 14.6 oz) (02/21 0124) Last BM Date: 08/14/11  Intake/Output from previous day: 02/20 0701 - 02/21 0700 In: 328 [I.V.:328] Out: -  Intake/Output this shift: Total I/O In: 328 [I.V.:328] Out: -   Lungs: Clear  Abd: Soft, good bowel sounds, no peritonitis.  No surgical scars  Extremities: No DVT signs or symptoms  Neuro: Intact, but sleepy.  Lab Results: CBC   Basename 08/15/11 0305 08/14/11 1935  WBC 10.1 15.2*  HGB 11.4* 13.8  HCT 34.1* 40.1  PLT 235 282   BMET  Basename 08/15/11 0305 08/14/11 1935  NA 140 140  K 3.8 3.4*  CL 107 102  CO2 28 28  GLUCOSE 113* 116*  BUN 10 9  CREATININE 0.66 0.63  CALCIUM 8.2* 9.8   PT/INR No results found for this basename: LABPROT:2,INR:2 in the last 72 hours ABG No results found for this basename: PHART:2,PCO2:2,PO2:2,HCO3:2 in the last 72 hours  Studies/Results: Ct Abdomen Pelvis W Contrast  08/14/2011  *RADIOLOGY REPORT*  Clinical Data: Mid to lower abdominal pain.  Nausea and vomiting. Fever.  CT ABDOMEN AND PELVIS WITH CONTRAST  Technique:  Multidetector CT imaging of the abdomen and pelvis was performed following the standard protocol during bolus administration of intravenous contrast.  Contrast: OMNIPAQUE IOHEXOL 300 MG/ML IV SOLN  Comparison: None.  Findings: Mild diffuse low density of the liver relative to the spleen. Small cyst in the posterior aspect of the dome of the liver.  Additional small cyst or a nonspecific area of low density in the anterior aspect of the upper liver  on the right, measuring 4 mm in maximum diameter on image #9.  Right iliac bone cyst.  Mildly dilated, fluid-filled small bowel loops in the lower abdomen and pelvis with normal caliber proximal and distal loops.  Minimal free peritoneal fluid in the pelvic cul-de-sac.  No bowel wall thickening or soft tissue stranding.  No enlarged lymph nodes.  Unremarkable spleen, pancreas, gallbladder, adrenal glands, kidneys, urinary bladder, uterus and ovaries.  Normal appearing appendix in the right upper pelvis.  Clear lung bases.  Mild lower thoracic spine degenerative changes.  IMPRESSION:  1.  Mildly dilated, fluid-filled small bowel loops in the lower abdomen and pelvis.  Differential considerations include ileus and mild partial obstruction. 2.  Small amount of probable physiological fluid in the pelvic cul- de-sac. 3.  Mild hepatic steatosis.  Original Report Authenticated By: Darrol Angel, M.D.    Anti-infectives: Anti-infectives    None      Assessment/Plan: s/p  Reassess for improvement.  Currently does not need NGT.  LOS: 1 day    Marta Lamas. Gae Bon, MD, FACS [phone] [id number] [facility] Surgery 08/15/2011

## 2011-08-15 NOTE — ED Provider Notes (Signed)
I saw and evaluated the patient, reviewed the resident's note and I agree with the findings and plan.  Loren Racer, MD 08/15/11 [phone]

## 2011-08-16 ENCOUNTER — Inpatient Hospital Stay (HOSPITAL_COMMUNITY): Payer: Medicaid Other

## 2011-08-16 LAB — BASIC METABOLIC PANEL
BUN: 7 mg/dL (ref 6–23)
Calcium: 8.5 mg/dL (ref 8.4–10.5)
GFR calc Af Amer: >90 mL/min (ref >90)
GFR calc non Af Amer: >90 mL/min (ref >90)
Glucose, Bld: 108 mg/dL — ABNORMAL HIGH (ref 70–99)
Potassium: 3.6 mEq/L (ref 3.5–5.1)

## 2011-08-16 LAB — CBC
HCT: 35.6 % — ABNORMAL LOW (ref 36.0–46.0)
Hemoglobin: 11.7 g/dL — ABNORMAL LOW (ref 12.0–15.0)
MCH: 28.5 pg (ref 26.0–34.0)
MCHC: 32.9 g/dL (ref 30.0–36.0)
RDW: 13.2 % (ref 11.5–15.5)

## 2011-08-16 NOTE — Progress Notes (Signed)
  Subjective: Conversation with pt and family via telephone interpreter. She is feeling much better. No flatus or BM yet, but minimal pain and no N/V.  Objective: Vital signs in last 24 hours: Temp:  [97.9 F (36.6 C)-98.9 F (37.2 C)] 97.9 F (36.6 C) (02/22 0524) Pulse Rate:  [80-94] 80  (02/22 0524) Resp:  [16-18] 16  (02/22 0524) BP: (120-143)/(78-87) 143/87 mmHg (02/22 0524) SpO2:  [96 %-99 %] 96 % (02/22 0524) Last BM Date: 08/14/11  Intake/Output this shift:    Physical Exam: BP 143/87  Pulse 80  Temp(Src) 97.9 F (36.6 C) (Oral)  Resp 16  Ht 5' (1.524 m)  Wt 55.3 kg (121 lb 14.6 oz)  BMI 23.81 kg/m2  SpO2 96% Abdomen: softer, ND, NT Few BS  Labs: CBC  Basename 08/16/11 0545 08/15/11 0305  WBC 7.2 10.1  HGB 11.7* 11.4*  HCT 35.6* 34.1*  PLT 213 235   BMET  Basename 08/16/11 0545 08/15/11 0305  NA 142 140  K 3.6 3.8  CL 110 107  CO2 26 28  GLUCOSE 108* 113*  BUN 7 10  CREATININE 0.65 0.66  CALCIUM 8.5 8.2*   LFT  Basename 08/15/11 0305 08/14/11 1935  PROT 6.5 --  ALBUMIN 3.2* --  AST 21 --  ALT 20 --  ALKPHOS 70 --  BILITOT 0.3 --  BILIDIR -- --  IBILI -- --  LIPASE -- 35   PT/INR No results found for this basename: LABPROT:2,INR:2 in the last 72 hours ABG No results found for this basename: PHART:2,PCO2:2,PO2:2,HCO3:2 in the last 72 hours  Studies/Results: Ct Abdomen Pelvis W Contrast  08/14/2011  *RADIOLOGY REPORT*  Clinical Data: Mid to lower abdominal pain.  Nausea and vomiting. Fever.  CT ABDOMEN AND PELVIS WITH CONTRAST  Technique:  Multidetector CT imaging of the abdomen and pelvis was performed following the standard protocol during bolus administration of intravenous contrast.  Contrast: OMNIPAQUE IOHEXOL 300 MG/ML IV SOLN  Comparison: None.  Findings: Mild diffuse low density of the liver relative to the spleen. Small cyst in the posterior aspect of the dome of the liver.  Additional small cyst or a nonspecific area of  low density in the anterior aspect of the upper liver on the right, measuring 4 mm in maximum diameter on image #9.  Right iliac bone cyst.  Mildly dilated, fluid-filled small bowel loops in the lower abdomen and pelvis with normal caliber proximal and distal loops.  Minimal free peritoneal fluid in the pelvic cul-de-sac.  No bowel wall thickening or soft tissue stranding.  No enlarged lymph nodes.  Unremarkable spleen, pancreas, gallbladder, adrenal glands, kidneys, urinary bladder, uterus and ovaries.  Normal appearing appendix in the right upper pelvis.  Clear lung bases.  Mild lower thoracic spine degenerative changes.  IMPRESSION:  1.  Mildly dilated, fluid-filled small bowel loops in the lower abdomen and pelvis.  Differential considerations include ileus and mild partial obstruction. 2.  Small amount of probable physiological fluid in the pelvic cul- de-sac. 3.  Mild hepatic steatosis.  Original Report Authenticated By: Darrol Angel, M.D.    Assessment: Principal Problem:  *SBO (small bowel obstruction) Active Problems:  HTN (hypertension)     Plan: WBC and lytes good. X-rays look better with contrast in colon. WIll start clears and encourage OOB walking. Hopefully CT contrast will induce BM  LOS: 2 days    Alyse Low 08/16/2011 8:05 AM

## 2011-08-16 NOTE — Progress Notes (Signed)
Subjective: Patient in room, says she is passing gas.  Is hungry and wanting food.  No BM yet   Objective: Vital signs in last 24 hours: Filed Vitals:   08/15/11 1500 08/15/11 1754 08/15/11 2253 08/16/11 0524  BP: 136/80 127/78 120/81 143/87  Pulse: 94 84 83 80  Temp: 98.3 F (36.8 C) 98.1 F (36.7 C) 98.3 F (36.8 C) 97.9 F (36.6 C)  TempSrc: Oral Oral Oral Oral  Resp: 16 16 16 16   Height:      Weight:      SpO2: 98% 96% 96% 96%   Weight change:   Intake/Output Summary (Last 24 hours) at 08/16/11 1115 Last data filed at 08/16/11 0941  Gross per 24 hour  Intake   2503 ml  Output   1100 ml  Net   1403 ml    Physical Exam: General: Awake, Oriented, No acute distress. HEENT: EOMI. Neck: Supple, no JVD CV: S1 and S2, rrr Lungs: Clear to ascultation bilaterally, no wheezing Abdomen: Soft, Nontender, Nondistended, +bowel sounds, obese,. Ext: Good pulses. Trace edema.   Lab Results:  [facility] 08/16/11 0545 08/15/11 0305  NA 142 140  K 3.6 3.8  CL 110 107  CO2 26 28  GLUCOSE 108* 113*  BUN 7 10  CREATININE 0.65 0.66  CALCIUM 8.5 8.2*  MG -- --  PHOS -- --    Basename 08/15/11 0305 08/14/11 1935  AST 21 28  ALT 20 25  ALKPHOS 70 92  BILITOT 0.3 0.2*  PROT 6.5 7.7  ALBUMIN 3.2* 3.9    Basename 08/14/11 1935  LIPASE 35  AMYLASE --    Basename 08/16/11 0545 08/15/11 0305 08/14/11 1935  WBC 7.2 10.1 --  NEUTROABS -- -- 9.9*  HGB 11.7* 11.4* --  HCT 35.6* 34.1* --  MCV 86.6 85.7 --  PLT 213 235 --    Basename 08/15/11 0305  CKTOTAL 70  CKMB 2.0  CKMBINDEX --  TROPONINI <0.30   No components found with this basename: POCBNP:3 No results found for this basename: DDIMER:2 in the last 72 hours No results found for this basename: HGBA1C:2 in the last 72 hours No results found for this basename: CHOL:2,HDL:2,LDLCALC:2,TRIG:2,CHOLHDL:2,LDLDIRECT:2 in the last 72 hours  Basename 08/15/11 0305  TSH 1.140  T4TOTAL --  T3FREE --  THYROIDAB --    No results found for this basename: VITAMINB12:2,FOLATE:2,FERRITIN:2,TIBC:2,IRON:2,RETICCTPCT:2 in the last 72 hours  Micro Results: No results found for this or any previous visit (from the past 240 hour(s)).  Studies/Results: Ct Abdomen Pelvis W Contrast  08/14/2011  *RADIOLOGY REPORT*  Clinical Data: Mid to lower abdominal pain.  Nausea and vomiting. Fever.  CT ABDOMEN AND PELVIS WITH CONTRAST  Technique:  Multidetector CT imaging of the abdomen and pelvis was performed following the standard protocol during bolus administration of intravenous contrast.  Contrast: OMNIPAQUE IOHEXOL 300 MG/ML IV SOLN  Comparison: None.  Findings: Mild diffuse low density of the liver relative to the spleen. Small cyst in the posterior aspect of the dome of the liver.  Additional small cyst or a nonspecific area of low density in the anterior aspect of the upper liver on the right, measuring 4 mm in maximum diameter on image #9.  Right iliac bone cyst.  Mildly dilated, fluid-filled small bowel loops in the lower abdomen and pelvis with normal caliber proximal and distal loops.  Minimal free peritoneal fluid in the pelvic cul-de-sac.  No bowel wall thickening or soft tissue stranding.  No enlarged lymph nodes.  Unremarkable spleen, pancreas, gallbladder, adrenal glands, kidneys, urinary bladder, uterus and ovaries.  Normal appearing appendix in the right upper pelvis.  Clear lung bases.  Mild lower thoracic spine degenerative changes.  IMPRESSION:  1.  Mildly dilated, fluid-filled small bowel loops in the lower abdomen and pelvis.  Differential considerations include ileus and mild partial obstruction. 2.  Small amount of probable physiological fluid in the pelvic cul- de-sac. 3.  Mild hepatic steatosis.  Original Report Authenticated By: Darrol Angel, M.D.   Dg Abd 2 Views  08/16/2011  *RADIOLOGY REPORT*  Clinical Data: Nausea, vomiting and fever with mid abdominal pain.  ABDOMEN - 2 VIEW  Comparison:  08/14/2011 CT.  Findings: Nonspecific bowel gas pattern without plain film evidence of bowel obstruction or free intraperitoneal air.  The fluid filled prominent size loops of small bowel noted on the CT are not adequately assessed by plain film exam.  IMPRESSION: Nonspecific bowel gas pattern without plain film evidence of bowel obstruction or free intraperitoneal air.  The fluid filled prominent size loops of small bowel noted on the CT are not adequately assessed by plain film exam.  Original Report Authenticated By: Fuller Canada, M.D.    Medications: I have reviewed the patient's current medications. Scheduled Meds:    . pantoprazole (PROTONIX) IV  40 mg Intravenous Q24H  . sodium chloride  3 mL Intravenous Q12H   Continuous Infusions:    . dextrose 5 % and 0.9 % NaCl with KCl 20 mEq/L 100 mL/hr at 08/16/11 0941   PRN Meds:.acetaminophen, acetaminophen, labetalol, morphine, ondansetron (ZOFRAN) IV, ondansetron  Assessment/Plan:   *SBO (small bowel obstruction)- NPO, no NG tube, appreciate surgery following, clears, x ray shows improvement   HTN (hypertension)- stable  Hypokalemia- repleated  Leukocytosis- resolved      LOS: 2 days  [name] Dahan, DO 08/16/2011, 11:15 AM

## 2011-08-17 MED ORDER — PANTOPRAZOLE SODIUM 40 MG PO TBEC
40.0000 mg | DELAYED_RELEASE_TABLET | Freq: Every day | ORAL | Status: DC
  Administered 2011-08-18: 40 mg via ORAL
  Filled 2011-08-17: qty 1

## 2011-08-17 NOTE — Progress Notes (Signed)
  Subjective: No issues.  Tolerating diet and denies pain.  Objective: Vital signs in last 24 hours: Temp:  [97.8 F (36.6 C)-98.8 F (37.1 C)] 98.7 F (37.1 C) (02/23 0541) Pulse Rate:  [78-81] 81  (02/23 0541) Resp:  [16-17] 16  (02/23 0541) BP: (125-147)/(72-90) 137/90 mmHg (02/23 0541) SpO2:  [97 %-98 %] 97 % (02/23 0541) Last BM Date: 08-19-2011  Intake/Output from previous day: Aug 18, 2022 0701 - 02/23 0700 In: 2699.9 [P.O.:840; I.V.:1859.9] Out: 2550 [Urine:2550] Intake/Output this shift: Total I/O In: 240 [P.O.:240] Out: 750 [Urine:750]  General appearance: cooperative and no distress GI: soft, non-tender; bowel sounds normal; no masses,  no organomegaly  Lab Results:   Basename Aug 19, 2011 0545 08/15/11 0305  WBC 7.2 10.1  HGB 11.7* 11.4*  HCT 35.6* 34.1*  PLT 213 235   BMET  Basename 19-Aug-2011 0545 08/15/11 0305  NA 142 140  K 3.6 3.8  CL 110 107  CO2 26 28  GLUCOSE 108* 113*  BUN 7 10  CREATININE 0.65 0.66  CALCIUM 8.5 8.2*   PT/INR No results found for this basename: LABPROT:2,INR:2 in the last 72 hours ABG No results found for this basename: PHART:2,PCO2:2,PO2:2,HCO3:2 in the last 72 hours  Studies/Results: Dg Abd 2 Views  August 19, 2011  *RADIOLOGY REPORT*  Clinical Data: Nausea, vomiting and fever with mid abdominal pain.  ABDOMEN - 2 VIEW  Comparison: 08/14/2011 CT.  Findings: Nonspecific bowel gas pattern without plain film evidence of bowel obstruction or free intraperitoneal air.  The fluid filled prominent size loops of small bowel noted on the CT are not adequately assessed by plain film exam.  IMPRESSION: Nonspecific bowel gas pattern without plain film evidence of bowel obstruction or free intraperitoneal air.  The fluid filled prominent size loops of small bowel noted on the CT are not adequately assessed by plain film exam.  Original Report Authenticated By: Fuller Canada, M.D.    Anti-infectives: Anti-infectives    None       Assessment/Plan: s/p * No surgery found * Advance diet No evidence of bowel obstruction.  This was likely due to enteritis.  Okay to advance diet as tolerated and okay for discharge.  LOS: 3 days    Lodema Pilot DAVID 08/17/2011

## 2011-08-17 NOTE — Progress Notes (Signed)
. Subjective: Patient in room, spoke with her through interpreter,  says she is passing gas and had 2 BMs since yesterday.  Is hungry and wanting more food as she is tolerating the clears ok   Objective: Vital signs in last 24 hours: Filed Vitals:   2011/08/25 0524 08/25/11 1400 08/25/11 2100 08/17/11 0541  BP: 143/87 125/72 147/84 137/90  Pulse: 80 78 79 81  Temp: 97.9 F (36.6 C) 97.8 F (36.6 C) 98.8 F (37.1 C) 98.7 F (37.1 C)  TempSrc: Oral Oral Oral Oral  Resp: 16 17 16 16   Height:      Weight:      SpO2: 96% 98% 98% 97%   Weight change:   Intake/Output Summary (Last 24 hours) at 08/17/11 1057 Last data filed at 08/17/11 0834  Gross per 24 hour  Intake 2696.87 ml  Output   2700 ml  Net  -3.13 ml    Physical Exam: General: Awake, Oriented, No acute distress. HEENT: EOMI. Neck: Supple, no JVD CV: S1 and S2, rrr Lungs: Clear to ascultation bilaterally, no wheezing Abdomen: Soft, Nontender, Nondistended, +bowel sounds, obese,. Ext: Good pulses. Trace edema.   Lab Results:  [facility] August 25, 2011 0545 08/15/11 0305  NA 142 140  K 3.6 3.8  CL 110 107  CO2 26 28  GLUCOSE 108* 113*  BUN 7 10  CREATININE 0.65 0.66  CALCIUM 8.5 8.2*  MG -- --  PHOS -- --    Basename 08/15/11 0305 08/14/11 1935  AST 21 28  ALT 20 25  ALKPHOS 70 92  BILITOT 0.3 0.2*  PROT 6.5 7.7  ALBUMIN 3.2* 3.9    Basename 08/14/11 1935  LIPASE 35  AMYLASE --    Basename 08/25/11 0545 08/15/11 0305 08/14/11 1935  WBC 7.2 10.1 --  NEUTROABS -- -- 9.9*  HGB 11.7* 11.4* --  HCT 35.6* 34.1* --  MCV 86.6 85.7 --  PLT 213 235 --    Basename 08/15/11 0305  CKTOTAL 70  CKMB 2.0  CKMBINDEX --  TROPONINI <0.30   No components found with this basename: POCBNP:3 No results found for this basename: DDIMER:2 in the last 72 hours No results found for this basename: HGBA1C:2 in the last 72 hours No results found for this basename: CHOL:2,HDL:2,LDLCALC:2,TRIG:2,CHOLHDL:2,LDLDIRECT:2 in  the last 72 hours  Basename 08/15/11 0305  TSH 1.140  T4TOTAL --  T3FREE --  THYROIDAB --     Micro Results: No results found for this or any previous visit (from the past 240 hour(s)).  Studies/Results: Dg Abd 2 Views  25-Aug-2011  *RADIOLOGY REPORT*  Clinical Data: Nausea, vomiting and fever with mid abdominal pain.  ABDOMEN - 2 VIEW  Comparison: 08/14/2011 CT.  Findings: Nonspecific bowel gas pattern without plain film evidence of bowel obstruction or free intraperitoneal air.  The fluid filled prominent size loops of small bowel noted on the CT are not adequately assessed by plain film exam.  IMPRESSION: Nonspecific bowel gas pattern without plain film evidence of bowel obstruction or free intraperitoneal air.  The fluid filled prominent size loops of small bowel noted on the CT are not adequately assessed by plain film exam.  Original Report Authenticated By: Fuller Canada, M.D.    Medications: I have reviewed the patient's current medications. Scheduled Meds:    . pantoprazole (PROTONIX) IV  40 mg Intravenous Q24H  . sodium chloride  3 mL Intravenous Q12H   Continuous Infusions:    . dextrose 5 % and 0.9 % NaCl with  KCl 20 mEq/L 100 mL/hr at 08/17/11 0834   PRN Meds:.acetaminophen, acetaminophen, labetalol, morphine, ondansetron (ZOFRAN) IV, ondansetron  Assessment/Plan:   *SBO (small bowel obstruction)- hope to advance diet today as patient is tolerating clears, no NG tube, appreciate surgery following- encourage ambulation   HTN (hypertension)- stable  Hypokalemia- repleated  Leukocytosis- resolved  ?D/C tomorrow if full diet tolerated    LOS: 3 days  Meleena Munroe, DO 08/17/2011, 10:57 AM

## 2011-08-18 DIAGNOSIS — K567 Ileus, unspecified: Secondary | ICD-10-CM | POA: Diagnosis present

## 2011-08-18 NOTE — Discharge Summary (Signed)
Discharge Summary  [name] [name] MR#: [id number]  DOB:07-04-1955  Date of Admission: 08/14/2011 Date of Discharge: 08/18/2011  Patient's PCP: No Pcp Per Pt  Attending Physician:Lauretta Sallas  Consults: Treatment Team:  Md Ccs, MD   Discharge Diagnoses:   SBO (small bowel obstruction) vs ileus after enteritis- resolved  HTN (hypertension) Hypokalemia- resolved       Brief Admitting History and Physical 56 year-old female with history of hypertension and no previous surgeries presented to the ER because of persistent abdominal pain. The pain is localized around the epigastric area has been constant. The pain started off suddenly around 4 PM this evening. Once patient came to the ER patient had thrown up twice there was no blood in it. The last time she had a bowel movement was today morning. It was normal and did not have any diarrhea. Patient has not had similar symptoms before. CAT scan of the abdomen and pelvis done shows small bowel obstruction versus ileus. Surgery was consulted by the ER physician at this time hospitalist will be admitting for further management. Patient denies any chest pain shorter breath fever chills cough or phlegm dizziness or any focal deficits.   Discharge Medications Medication List  As of 08/18/2011 10:55 AM   TAKE these medications         lisinopril-hydrochlorothiazide 20-12.5 MG per tablet   Commonly known as: PRINZIDE,ZESTORETIC   Take 1 tablet by mouth every morning.            Hospital Course: *SBO (small bowel obstruction) vs ileus after enteritis- no NG tube, NPO/IVF, advanced diet as tolerated   HTN (hypertension)- continue home med,  PCP to follow BP     Day of Discharge BP 135/94  Pulse 85  Temp(Src) 98.3 F (36.8 C) (Oral)  Resp 18  Ht 5' (1.524 m)  Wt 55.3 kg (121 lb 14.6 oz)  BMI 23.81 kg/m2  SpO2 97%  No results found for this or any previous visit (from the past 48 hour(s)).  Ct Abdomen Pelvis W  Contrast  08/14/2011  *RADIOLOGY REPORT*  Clinical Data: Mid to lower abdominal pain.  Nausea and vomiting. Fever.  CT ABDOMEN AND PELVIS WITH CONTRAST  Technique:  Multidetector CT imaging of the abdomen and pelvis was performed following the standard protocol during bolus administration of intravenous contrast.  Contrast: OMNIPAQUE IOHEXOL 300 MG/ML IV SOLN  Comparison: None.  Findings: Mild diffuse low density of the liver relative to the spleen. Small cyst in the posterior aspect of the dome of the liver.  Additional small cyst or a nonspecific area of low density in the anterior aspect of the upper liver on the right, measuring 4 mm in maximum diameter on image #9.  Right iliac bone cyst.  Mildly dilated, fluid-filled small bowel loops in the lower abdomen and pelvis with normal caliber proximal and distal loops.  Minimal free peritoneal fluid in the pelvic cul-de-sac.  No bowel wall thickening or soft tissue stranding.  No enlarged lymph nodes.  Unremarkable spleen, pancreas, gallbladder, adrenal glands, kidneys, urinary bladder, uterus and ovaries.  Normal appearing appendix in the right upper pelvis.  Clear lung bases.  Mild lower thoracic spine degenerative changes.  IMPRESSION:  1.  Mildly dilated, fluid-filled small bowel loops in the lower abdomen and pelvis.  Differential considerations include ileus and mild partial obstruction. 2.  Small amount of probable physiological fluid in the pelvic cul- de-sac. 3.  Mild hepatic steatosis.  Original Report Authenticated By: Darrol Angel,  M.D.   Dg Abd 2 Views  08/16/2011  *RADIOLOGY REPORT*  Clinical Data: Nausea, vomiting and fever with mid abdominal pain.  ABDOMEN - 2 VIEW  Comparison: 08/14/2011 CT.  Findings: Nonspecific bowel gas pattern without plain film evidence of bowel obstruction or free intraperitoneal air.  The fluid filled prominent size loops of small bowel noted on the CT are not adequately assessed by plain film exam.  IMPRESSION:  Nonspecific bowel gas pattern without plain film evidence of bowel obstruction or free intraperitoneal air.  The fluid filled prominent size loops of small bowel noted on the CT are not adequately assessed by plain film exam.  Original Report Authenticated By: Fuller Canada, M.D.     Disposition: home  Diet: cardiac  Activity: as tolerated  Follow-up Appts: Need PCP  Discharge Orders    Future Orders Please Complete By Expires   Diet - low sodium heart healthy      Increase activity slowly         Time spent on discharge, talking to the patient, and coordinating care: 32 mins.   SignedMarlin Canary, DO 08/18/2011, 10:55 AM

## 2011-08-18 NOTE — Progress Notes (Signed)
Dc instructions gone over using interpretor. Follow up appointment is to be made with [facility]. Diet and symptom management discussed with patient and family. Home meds gone over. Patient verbalized understanding of all.

## 2011-10-03 ENCOUNTER — Emergency Department (HOSPITAL_COMMUNITY)
Admission: EM | Admit: 2011-10-03 | Discharge: 2011-10-03 | Disposition: A | Discharge status: Home / Self Care | Payer: Medicaid Other | Attending: Emergency Medicine | Admitting: Emergency Medicine

## 2011-10-03 DIAGNOSIS — M549 Dorsalgia, unspecified: Secondary | ICD-10-CM | POA: Insufficient documentation

## 2011-10-03 DIAGNOSIS — M5380 Other specified dorsopathies, site unspecified: Secondary | ICD-10-CM | POA: Insufficient documentation

## 2011-10-03 DIAGNOSIS — IMO0001 Reserved for inherently not codable concepts without codable children: Secondary | ICD-10-CM | POA: Insufficient documentation

## 2011-10-03 DIAGNOSIS — I10 Essential (primary) hypertension: Secondary | ICD-10-CM | POA: Insufficient documentation

## 2011-10-03 DIAGNOSIS — M62838 Other muscle spasm: Secondary | ICD-10-CM

## 2011-10-03 DIAGNOSIS — M542 Cervicalgia: Secondary | ICD-10-CM | POA: Insufficient documentation

## 2011-10-03 LAB — DIFFERENTIAL
Basophils Absolute: 0 10*3/uL (ref 0.0–0.1)
Lymphocytes Relative: 38 % (ref 12–46)
Monocytes Absolute: 0.4 10*3/uL (ref 0.1–1.0)
Monocytes Relative: 5 % (ref 3–12)
Neutro Abs: 4.9 10*3/uL (ref 1.7–7.7)
Neutrophils Relative %: 55 % (ref 43–77)

## 2011-10-03 LAB — COMPREHENSIVE METABOLIC PANEL
AST: 35 U/L (ref 0–37)
Alkaline Phosphatase: 78 U/L (ref 39–117)
CO2: 29 mEq/L (ref 19–32)
Chloride: 104 mEq/L (ref 96–112)
Creatinine, Ser: 0.57 mg/dL (ref 0.50–1.10)
GFR calc non Af Amer: >90 mL/min (ref >90)
Potassium: 3.8 mEq/L (ref 3.5–5.1)
Total Bilirubin: 0.5 mg/dL (ref 0.3–1.2)

## 2011-10-03 LAB — CBC
HCT: 39.2 % (ref 36.0–46.0)
Hemoglobin: 13.3 g/dL (ref 12.0–15.0)
RDW: 13 % (ref 11.5–15.5)
WBC: 8.9 10*3/uL (ref 4.0–10.5)

## 2011-10-03 LAB — URINALYSIS, ROUTINE W REFLEX MICROSCOPIC
Bilirubin Urine: NEGATIVE
Glucose, UA: NEGATIVE mg/dL
Hgb urine dipstick: NEGATIVE
Ketones, ur: NEGATIVE mg/dL
Protein, ur: NEGATIVE mg/dL
Urobilinogen, UA: 0.2 mg/dL (ref 0.0–1.0)

## 2011-10-03 LAB — URINE MICROSCOPIC-ADD ON

## 2011-10-03 MED ORDER — METHOCARBAMOL 100 MG/ML IJ SOLN
1000.0000 mg | INTRAVENOUS | Status: AC
  Administered 2011-10-03: 1000 mg via INTRAVENOUS
  Filled 2011-10-03: qty 10

## 2011-10-03 MED ORDER — METHOCARBAMOL 500 MG PO TABS: 500.0000 mg | ORAL_TABLET | Freq: Two times a day (BID) | ORAL | Status: AC

## 2011-10-03 MED ORDER — KETOROLAC TROMETHAMINE 30 MG/ML IJ SOLN
30.0000 mg | Freq: Once | INTRAMUSCULAR | Status: AC
  Administered 2011-10-03: 30 mg via INTRAVENOUS
  Filled 2011-10-03: qty 1

## 2011-10-03 MED ORDER — SODIUM CHLORIDE 0.9 % IV BOLUS (SEPSIS)
1000.0000 mL | Freq: Once | INTRAVENOUS | Status: AC
  Administered 2011-10-03: 1000 mL via INTRAVENOUS

## 2011-10-03 MED ORDER — IBUPROFEN 600 MG PO TABS: 600.0000 mg | ORAL_TABLET | Freq: Four times a day (QID) | ORAL | Status: AC | PRN

## 2011-10-03 MED ORDER — METHOCARBAMOL 100 MG/ML IJ SOLN: 1000.0000 mg | Freq: Once | INTRAMUSCULAR | Status: DC

## 2011-10-03 MED ORDER — TRAMADOL HCL 50 MG PO TABS: 50.0000 mg | ORAL_TABLET | Freq: Four times a day (QID) | ORAL | Status: AC | PRN

## 2011-10-03 NOTE — ED Notes (Signed)
WUJ:[id number]<JY> Expected date:10/03/11<BR> Expected time:12:11 PM<BR> Means of arrival:Ambulance<BR> Comments:<BR> EMS 31 GC- 56 y/o female pain in right side of body for 3 days.

## 2011-10-03 NOTE — ED Notes (Signed)
Interpreter in. C/o pain "right half of body". MOE x 4.

## 2011-10-03 NOTE — ED Notes (Signed)
RN with refugee services in. No further information gathered.

## 2011-10-03 NOTE — ED Provider Notes (Signed)
History     CSN: [id number]  Arrival date & time 10/03/11  1217   First MD Initiated Contact with Patient 10/03/11 1313      Chief Complaint  Patient presents with  . Pain    (Consider location/radiation/quality/duration/timing/severity/associated sxs/prior treatment) HPI Pt presents with muscle spasms of upper/lower back and down right leg for several days. No fever, chills, N/V, focal weakness, numbness. Pt does not speak english. Interpreter in room to translate for patient Past Medical History  Diagnosis Date  . Hypertension     No past surgical history on file.  No family history on file.  History  Substance Use Topics  . Smoking status: Never Smoker   . Smokeless tobacco: Not on file  . Alcohol Use: No    OB History    Grav Para Term Preterm Abortions TAB SAB Ect Mult Living                  Review of Systems  Constitutional: Negative for fever and chills.  HENT: Positive for neck pain. Negative for neck stiffness.   Respiratory: Negative for shortness of breath.   Cardiovascular: Negative for chest pain.  Gastrointestinal: Negative for nausea, vomiting and abdominal pain.  Musculoskeletal: Positive for myalgias and back pain.  Skin: Negative for rash and wound.  Neurological: Negative for weakness and numbness.    Allergies  Review of patient's allergies indicates no known allergies.  Home Medications   Current Outpatient Rx  Name Route Sig Dispense Refill  . IBUPROFEN 600 MG PO TABS Oral Take 1 tablet (600 mg total) by mouth every 6 (six) hours as needed for pain. 30 tablet 0  . LISINOPRIL-HYDROCHLOROTHIAZIDE 20-12.5 MG PO TABS Oral Take 1 tablet by mouth every morning.     Marland Kitchen METHOCARBAMOL 500 MG PO TABS Oral Take 1 tablet (500 mg total) by mouth 2 (two) times daily. 20 tablet 0  . TRAMADOL HCL 50 MG PO TABS Oral Take 1 tablet (50 mg total) by mouth every 6 (six) hours as needed for pain. 15 tablet 0    BP 149/96  Pulse 88  Temp(Src) 98 F  (36.7 C) (Oral)  Resp 16  SpO2 97%  Physical Exam  Nursing note and vitals reviewed. Constitutional: She is oriented to person, place, and time. She appears well-developed and well-nourished. No distress.  HENT:  Head: Normocephalic and atraumatic.  Mouth/Throat: Oropharynx is clear and moist.  Eyes: EOM are normal. Pupils are equal, round, and reactive to light.  Neck: Normal range of motion. Neck supple.       TTP over R trapezius. +muscle spasm. No mid line cervical TTP  Cardiovascular: Normal rate and regular rhythm.   Pulmonary/Chest: Effort normal and breath sounds normal. No respiratory distress. She has no wheezes. She has no rales.  Abdominal: Soft. Bowel sounds are normal. She exhibits no mass. There is no tenderness. There is no rebound and no guarding.  Musculoskeletal: Normal range of motion. She exhibits tenderness. She exhibits no edema.       TTP over rhomboids on R and R lumbar paraspinal muscles. No mid line TTP. No obvious trauma  Neurological: She is alert and oriented to person, place, and time.       Moves all ext without deficit. Sensation grossly intact  Skin: Skin is warm and dry. No rash noted. No erythema.  Psychiatric: She has a normal mood and affect. Her behavior is normal.    ED Course  Procedures (including critical care  time)  Labs Reviewed  URINALYSIS, ROUTINE W REFLEX MICROSCOPIC - Abnormal; Notable for the following:    Leukocytes, UA TRACE (*)    All other components within normal limits  CBC  DIFFERENTIAL  COMPREHENSIVE METABOLIC PANEL  URINE MICROSCOPIC-ADD ON   No results found.   1. Muscle spasm       MDM          Loren Racer, MD 10/03/11 1514

## 2011-10-03 NOTE — ED Notes (Signed)
Pt c/o right sided pain, points all the way from head to legs. Does not speak [place].

## 2011-10-03 NOTE — ED Notes (Signed)
Interrupter at bedside can only stay until 1400 Hrs

## 2011-11-13 ENCOUNTER — Encounter (HOSPITAL_COMMUNITY): Payer: Self-pay | Admitting: Emergency Medicine

## 2011-11-13 ENCOUNTER — Emergency Department (HOSPITAL_COMMUNITY)
Admission: EM | Admit: 2011-11-13 | Discharge: 2011-11-13 | Disposition: A | Discharge status: Home / Self Care | Payer: Medicaid Other | Source: Home / Self Care | Attending: Emergency Medicine | Admitting: Emergency Medicine

## 2011-11-13 DIAGNOSIS — K529 Noninfective gastroenteritis and colitis, unspecified: Secondary | ICD-10-CM

## 2011-11-13 DIAGNOSIS — K5289 Other specified noninfective gastroenteritis and colitis: Secondary | ICD-10-CM

## 2011-11-13 MED ORDER — DICYCLOMINE HCL 20 MG PO TABS: 20.0000 mg | ORAL_TABLET | Freq: Two times a day (BID) | ORAL | Status: DC

## 2011-11-13 MED ORDER — ONDANSETRON 8 MG PO TBDP: 8.0000 mg | ORAL_TABLET | Freq: Three times a day (TID) | ORAL | Status: AC | PRN

## 2011-11-13 NOTE — Discharge Instructions (Signed)

## 2011-11-13 NOTE — ED Notes (Addendum)
Son states that pt had epigastric pain and nausea earlier this evening that has now resolved.  Reports she had not eaten all day when pain began.. Denies fever, diarrhea or vomiting.  Son translating for pt.

## 2011-11-13 NOTE — ED Provider Notes (Signed)
Chief Complaint  Patient presents with  . Abdominal Pain    History of Present Illness:   The patient is a 56 year old Nepali female who this afternoon around 2 developed periumbilical abdominal pain which was worse after she ate some fruit. She filled bit dizzy and nauseated and had some headache. She did not vomit. She denies any fever, diarrhea, blood in the stool, or urinary symptoms. The patient speaks no English, will is currently by her 2 sons who speak very good [place] and serves as her interpreters.  Review of Systems:  Other than noted above, the patient denies any of the following symptoms: Constitutional:  No fever, chills, fatigue, weight loss or anorexia. Lungs:  No cough or shortness of breath. Heart:  No chest pain, palpitations, syncope or edema.  No cardiac history. Abdomen:  No nausea, vomiting, hematememesis, melena, diarrhea, or hematochezia. GU:  No dysuria, frequency, urgency, or hematuria. Gyn:  No vaginal discharge, itching, abnormal bleeding or pelvic pain. Skin:  No rash or itching.  PMFSH:  Past medical history, family history, social history, meds, and allergies were reviewed.  No prior abdominal surgeries, past history of GI problems, STDs or GYN problems.  No history of aspirin or NSAID use.  No excessive alcohol intake.  Physical Exam:   Vital signs:  BP 155/99  Pulse 81  Temp(Src) 98.3 F (36.8 C) (Oral)  Resp 18  SpO2 98% Gen:  Alert, oriented, in no distress. Lungs:  Breath sounds clear and equal bilaterally.  No wheezes, rales or rhonchi. Heart:  Regular rhythm.  No gallops or murmers.   Abdomen:  Abdomen was soft, flat, nondistended. There was no tenderness to palpation, guarding, rebound. No organomegaly or mass. Bowel sounds are normally active. No pulsatile midline abdominal mass or bruit. I. and Murphy's punch were negative. Skin:  Clear, warm and dry.  No rash.   Assessment:  The encounter diagnosis was Gastroenteritis.  Plan:   1.  The  following meds were prescribed:   New Prescriptions   DICYCLOMINE (BENTYL) 20 MG TABLET    Take 1 tablet (20 mg total) by mouth 2 (two) times daily.   ONDANSETRON (ZOFRAN ODT) 8 MG DISINTEGRATING TABLET    Take 1 tablet (8 mg total) by mouth every 8 (eight) hours as needed for nausea.   2.  The patient was instructed in symptomatic care and handouts were given. 3.  The patient was told to return if becoming worse in any way, if no better in 3 or 4 days, and given some red flag symptoms that would indicate earlier return.    Reuben Likes, MD 11/13/11 2222

## 2011-11-27 ENCOUNTER — Encounter (HOSPITAL_COMMUNITY): Payer: Self-pay | Admitting: *Deleted

## 2011-11-27 ENCOUNTER — Emergency Department (HOSPITAL_COMMUNITY): Payer: Medicaid Other

## 2011-11-27 ENCOUNTER — Emergency Department (HOSPITAL_COMMUNITY)
Admission: EM | Admit: 2011-11-27 | Discharge: 2011-11-27 | Disposition: A | Discharge status: Home / Self Care | Payer: Medicaid Other | Attending: Emergency Medicine | Admitting: Emergency Medicine

## 2011-11-27 DIAGNOSIS — R5383 Other fatigue: Secondary | ICD-10-CM | POA: Insufficient documentation

## 2011-11-27 DIAGNOSIS — R112 Nausea with vomiting, unspecified: Secondary | ICD-10-CM | POA: Insufficient documentation

## 2011-11-27 DIAGNOSIS — I10 Essential (primary) hypertension: Secondary | ICD-10-CM | POA: Insufficient documentation

## 2011-11-27 DIAGNOSIS — R51 Headache: Secondary | ICD-10-CM | POA: Insufficient documentation

## 2011-11-27 DIAGNOSIS — R5381 Other malaise: Secondary | ICD-10-CM | POA: Insufficient documentation

## 2011-11-27 MED ORDER — PROMETHAZINE HCL 25 MG PO TABS: 25.0000 mg | ORAL_TABLET | Freq: Four times a day (QID) | ORAL | Status: DC | PRN

## 2011-11-27 MED ORDER — DIPHENHYDRAMINE HCL 50 MG/ML IJ SOLN
12.5000 mg | Freq: Once | INTRAMUSCULAR | Status: AC
  Administered 2011-11-27: 12.5 mg via INTRAVENOUS
  Filled 2011-11-27: qty 1

## 2011-11-27 MED ORDER — SODIUM CHLORIDE 0.9 % IV BOLUS (SEPSIS)
1000.0000 mL | Freq: Once | INTRAVENOUS | Status: AC
  Administered 2011-11-27: 1000 mL via INTRAVENOUS

## 2011-11-27 MED ORDER — METOCLOPRAMIDE HCL 5 MG/ML IJ SOLN
10.0000 mg | Freq: Once | INTRAMUSCULAR | Status: AC
  Administered 2011-11-27: 10 mg via INTRAVENOUS
  Filled 2011-11-27: qty 2

## 2011-11-27 MED ORDER — KETOROLAC TROMETHAMINE 30 MG/ML IJ SOLN
30.0000 mg | Freq: Once | INTRAMUSCULAR | Status: AC
  Administered 2011-11-27: 30 mg via INTRAVENOUS
  Filled 2011-11-27: qty 1

## 2011-11-27 MED ORDER — HYDROCODONE-ACETAMINOPHEN 5-325 MG PO TABS: 2.0000 | ORAL_TABLET | ORAL | Status: AC | PRN

## 2011-11-27 NOTE — ED Provider Notes (Signed)
History     CSN: [id number]  Arrival date & time 11/27/11  8657   First MD Initiated Contact with Patient 11/27/11 [phone]      Chief Complaint  Patient presents with  . Headache    (Consider location/radiation/quality/duration/timing/severity/associated sxs/prior treatment) Patient is a 56 y.o. female presenting with headaches. The history is provided by the patient. No language interpreter was used.  Headache  This is a new problem. The current episode started yesterday. The problem occurs constantly. The problem has been gradually worsening. The headache is associated with bright light. The pain does not radiate. Associated symptoms include nausea and vomiting. She has tried nothing for the symptoms. The treatment provided no relief.  Pt complains of a headache since yesterday.  Pt has had headaches in the past  Past Medical History  Diagnosis Date  . Hypertension     History reviewed. No pertinent past surgical history.  History reviewed. No pertinent family history.  History  Substance Use Topics  . Smoking status: Never Smoker   . Smokeless tobacco: Not on file  . Alcohol Use: No    OB History    Grav Para Term Preterm Abortions TAB SAB Ect Mult Living                  Review of Systems  Gastrointestinal: Positive for nausea and vomiting.  Neurological: Positive for headaches.  All other systems reviewed and are negative.    Allergies  Review of patient's allergies indicates no known allergies.  Home Medications   Current Outpatient Rx  Name Route Sig Dispense Refill  . LISINOPRIL-HYDROCHLOROTHIAZIDE 20-12.5 MG PO TABS Oral Take 1 tablet by mouth every morning.       BP 135/95  Pulse 89  Temp 98.9 F (37.2 C)  Resp 18  SpO2 96%  Physical Exam  Nursing note and vitals reviewed. Constitutional: She is oriented to person, place, and time. She appears well-developed and well-nourished.  HENT:  Head: Normocephalic and atraumatic.  Right Ear:  External ear normal.  Left Ear: External ear normal.  Nose: Nose normal.  Mouth/Throat: Oropharynx is clear and moist.  Eyes: Conjunctivae and EOM are normal. Pupils are equal, round, and reactive to light.  Neck: Normal range of motion. Neck supple.  Cardiovascular: Normal rate.   Pulmonary/Chest: Effort normal and breath sounds normal.  Abdominal: Soft. Bowel sounds are normal.  Musculoskeletal: Normal range of motion.  Neurological: She is alert and oriented to person, place, and time. She has normal reflexes.  Skin: Skin is warm.  Psychiatric: She has a normal mood and affect.    ED Course  Procedures (including critical care time)  Labs Reviewed - No data to display Ct Head Wo Contrast  11/27/2011  *RADIOLOGY REPORT*  Clinical Data: Headache.  Nausea.  Lethargy.  High blood pressure.  CT HEAD WITHOUT CONTRAST  Technique:  Contiguous axial images were obtained from the base of the skull through the vertex without contrast.  Comparison: None.  Findings: No intracranial hemorrhage.  Mild white matter type changes may represent result of small vessel disease.  No CT evidence of large acute infarct.  No intracranial mass lesion detected on this unenhanced exam.  No hydrocephalus.  Ectatic vertebral artery and basilar artery.  Calcified carotid arteries.  Visualized sinuses and mastoid air cells are clear.  IMPRESSION: No intracranial hemorrhage.  Mild nonspecific white matter type changes may be related to result of small vessel disease in this hypertensive patient.  No CT evidence  of large acute infarct.  Intracranial vascular calcifications.  Original Report Authenticated By: Fuller Canada, M.D.     No diagnosis found.    MDM  Pt reports relief with medication.   Pt advised to follow up with her MD for recheck        Lonia Skinner [place], [place] 11/27/11 0938  Lonia Skinner [place], [place] 11/27/11 [phone]

## 2011-11-27 NOTE — ED Notes (Signed)
Pt c/o sudden onset of HA tonight, also n/v.

## 2011-11-27 NOTE — ED Notes (Signed)
Relative states she had "surgery on her head", could not specify.

## 2011-11-27 NOTE — Discharge Instructions (Signed)

## 2011-12-01 NOTE — ED Provider Notes (Signed)
Medical screening examination/treatment/procedure(s) were performed by non-physician practitioner and as supervising physician I was immediately available for consultation/collaboration.  Olivia Mackie, MD 12/01/11 [phone]

## 2011-12-07 ENCOUNTER — Encounter (HOSPITAL_COMMUNITY): Payer: Self-pay | Admitting: Emergency Medicine

## 2011-12-07 ENCOUNTER — Emergency Department (HOSPITAL_COMMUNITY)
Admission: EM | Admit: 2011-12-07 | Discharge: 2011-12-07 | Disposition: A | Discharge status: Home / Self Care | Payer: Medicaid Other | Source: Home / Self Care | Attending: Emergency Medicine | Admitting: Emergency Medicine

## 2011-12-07 DIAGNOSIS — G43909 Migraine, unspecified, not intractable, without status migrainosus: Secondary | ICD-10-CM

## 2011-12-07 DIAGNOSIS — I10 Essential (primary) hypertension: Secondary | ICD-10-CM

## 2011-12-07 MED ORDER — AMLODIPINE BESYLATE 5 MG PO TABS: 5.0000 mg | ORAL_TABLET | Freq: Every day | ORAL | Status: DC

## 2011-12-07 MED ORDER — CLONIDINE HCL 0.1 MG PO TABS
ORAL_TABLET | ORAL | Status: AC
  Filled 2011-12-07: qty 1

## 2011-12-07 MED ORDER — KETOROLAC TROMETHAMINE 30 MG/ML IJ SOLN
INTRAMUSCULAR | Status: AC
  Filled 2011-12-07: qty 1

## 2011-12-07 MED ORDER — TRAMADOL HCL 50 MG PO TABS: 100.0000 mg | ORAL_TABLET | Freq: Three times a day (TID) | ORAL | Status: AC | PRN

## 2011-12-07 MED ORDER — KETOROLAC TROMETHAMINE 60 MG/2ML IM SOLN
30.0000 mg | Freq: Once | INTRAMUSCULAR | Status: AC
  Administered 2011-12-07: 30 mg via INTRAMUSCULAR

## 2011-12-07 MED ORDER — AMLODIPINE BESYLATE 10 MG PO TABS: 10.0000 mg | ORAL_TABLET | Freq: Every day | ORAL | Status: DC

## 2011-12-07 MED ORDER — CLONIDINE HCL 0.1 MG PO TABS
0.1000 mg | ORAL_TABLET | Freq: Once | ORAL | Status: AC
  Administered 2011-12-07: 0.1 mg via ORAL

## 2011-12-07 MED ORDER — DEXAMETHASONE SODIUM PHOSPHATE 10 MG/ML IJ SOLN
INTRAMUSCULAR | Status: AC
  Filled 2011-12-07: qty 1

## 2011-12-07 MED ORDER — DEXAMETHASONE SODIUM PHOSPHATE 10 MG/ML IJ SOLN
10.0000 mg | Freq: Once | INTRAMUSCULAR | Status: AC
  Administered 2011-12-07: 10 mg via INTRAMUSCULAR

## 2011-12-07 MED ORDER — DIPHENHYDRAMINE HCL 50 MG/ML IJ SOLN
INTRAMUSCULAR | Status: AC
  Filled 2011-12-07: qty 1

## 2011-12-07 MED ORDER — DIPHENHYDRAMINE HCL 50 MG/ML IJ SOLN
25.0000 mg | Freq: Once | INTRAMUSCULAR | Status: AC
  Administered 2011-12-07: 25 mg via INTRAMUSCULAR

## 2011-12-07 MED ORDER — TRAMADOL HCL 50 MG PO TABS: 100.0000 mg | ORAL_TABLET | Freq: Three times a day (TID) | ORAL | Status: DC | PRN

## 2011-12-07 NOTE — ED Notes (Signed)
Headache, involving front and back of head.  C/o nausea, no vomiting. Onset of headache pain 2 hours ago.  History of the same.  Patient ran out of blood pressure medicine.  Out of medicine for one month.

## 2011-12-07 NOTE — ED Notes (Signed)
Notified dr Lorenz Coaster of vital signs recheck.  Notified of how patient feels

## 2011-12-07 NOTE — ED Notes (Signed)
Patient is vomiting

## 2011-12-07 NOTE — ED Provider Notes (Signed)
Chief Complaint  Patient presents with  . Headache    History of Present Illness:   The patient is a 56 year old female with a 2 hour history of a severe, throbbing, global headache associated with nausea, vomiting, photophobia, and phonophobia. She has gotten these before and in fact she was at the emergency room earlier this month for the same thing. At that time her blood pressure was mildly elevated. She ran out of her blood pressure meds a couple weeks ago. She was on amlodipine 5 mg a day. Her sons who are with her today state that whenever she runs out of her blood pressure pills and her blood pressure goes up she gets a similar migraine headache. The state that this is not the worst headache that she's had. She denies any fever, chills, stiff neck, visual disturbances, paresthesias, weakness, or difficulty with speech. The patient is [place] and does not speak [place]. Her sons who are with her provided translation.  Review of Systems:  Other than noted above, the patient denies any of the following symptoms: Systemic:  No fever, chills, fatigue, photophobia, stiff neck. Eye:  No redness, eye pain, discharge, blurred vision, or diplopia. ENT:  No nasal congestion, rhinorrhea, sinus pressure or pain, sneezing, earache, or sore throat.  No jaw claudication. Neuro:  No paresthesias, loss of consciousness, seizure activity, muscle weakness, trouble with coordination or gait, trouble speaking or swallowing. Psych:  No depression, anxiety or trouble sleeping.  PMFSH:  Past medical history, family history, social history, meds, and allergies were reviewed.  Physical Exam:   Vital signs:  BP 173/96  Pulse 82  Temp 98 F (36.7 C) (Oral)  Resp 22  SpO2 96% Filed Vitals:   12/07/11 1936 12/07/11 1938 12/07/11 2052  BP: 189/109 175/105 173/96  Pulse: 86  82  Temp: 98 F (36.7 C)    TempSrc: Oral    Resp: 22    SpO2: 96%     General:  Alert and oriented. She is lying on the exam table and  moaning and holding her head. Eye:  Lids and conjunctivas normal.  PERRL,  Full EOMs.  Fundi benign with normal discs and vessels. ENT:  No cranial or facial tenderness to palpation.  TMs and canals clear.  Nasal mucosa was normal and uncongested without any drainage. No intra oral lesions, pharynx clear, mucous membranes moist, dentition normal. Neck:  Supple, full ROM, no tenderness to palpation.  No adenopathy or mass. Neuro:  Alert and orented times 3.  Speech was clear, fluent, and appropriate.  Cranial nerves intact. No pronator drift, muscle strength normal. Finger to nose normal.  DTRs were 2+ and symmetrical.Station and gait were normal.  Romberg's sign was normal.  Able to perform tandem gait well. Psych:  Normal affect.  Medications given in [facility]:  She was given Decadron 10 mg IM, Toradol 30 mg IM, and Benadryl 25 mg IM followed by clonidine 0.1 mg by mouth. Her blood pressure came down a bit, and her headache improved considerably.  Assessment:  The primary encounter diagnosis was Migraine headache. A diagnosis of Hypertension, uncontrolled was also pertinent to this visit.  Plan:   1.  The following meds were prescribed:   New Prescriptions   AMLODIPINE (NORVASC) 5 MG TABLET    Take 1 tablet (5 mg total) by mouth daily.   TRAMADOL (ULTRAM) 50 MG TABLET    Take 2 tablets (100 mg total) by mouth every 8 (eight) hours as needed for pain.  2.  The patient was instructed in symptomatic care and handouts were given. 3.  The patient was told to return if becoming worse in any way, if no better in 3 or 4 days, and given some red flag symptoms that would indicate earlier return.  Follow up:  The patient was told to follow up she was told to followup next week with her primary care physician for recheck on her blood pressure.     Reuben Likes, MD 12/07/11 [phone]

## 2011-12-07 NOTE — Discharge Instructions (Signed)
Blood pressure over the ideal can put you at higher risk for stroke, heart disease, and kidney failure.  For this reason, it's important to try to get your blood pressure as close as possible to the ideal.  The ideal blood pressure is 120/80.  Blood pressures from 120-139 systolic over 80-89 diastolic are labeled as "prehypertension."  This means you are at higher risk of developing hypertension in the future.  Blood pressures in this range are not treated with medication, but lifestyle changes are recommended to prevent progression to hypertension.  Blood pressures of 140 and above systolic over 90 and above diastolic are classified as hypertension and are treated with medications.  Lifestyle changes which can benefit both prehypertension and hypertension include the following:   Salt and sodium restriction.  Weight loss.  Regular exercise.  Avoidance of tobacco.  Avoidance of excess alcohol.  The "D.A.S.H" diet.   People with hypertension and prehypertension should limit their salt intake to less than 1500 mg daily.  Reading the nutrition information on the label of many prepared foods can give you an idea of how much sodium you're consuming at each meal.  Remember that the most important number on the nutrition information is the serving size.  It may be smaller than you think.  Try to avoid adding extra salt at the table.  You may add small amounts of salt while cooking.  Remember that salt is an acquired taste and you may get used to a using a whole lot less salt than you are using now.  Using less salt lets the food's natural flavors come through.  You might want to consider using salt substitutes, potassium chloride, pepper, or blends of herbs and spices to enhance the flavor of your food.  Foods that contain the most salt include: processed meats (like ham, bacon, lunch meat, sausage, hot dogs, and breakfast meat), chips, pretzels, salted nuts, soups, salty snacks, canned foods, junk  food, fast food, restaurant food, mustard, pickles, pizza, popcorn, soy sauce, and worcestershire sauce--quite a list!  You might ask, "Is there anything I can eat?"  The answer is, "yes."  Fruits and vegetables are usually low in salt.  Fresh is better than frozen which is better than canned.  If you have canned vegetables, you can cut down on the salt content by rinsing them in tap water 3 times before cooking.     Weight loss is the second thing you can do to lower your blood pressure.  Getting to and maintaining ideal weight will often normalize your blood pressure and allow you to avoid medications, entirely, cut way down on your dosage of medications, or allow to wean off your meds.  (Note, this should only be done under the supervision of your primary care doctor.)  Of course, weight loss takes time and you may need to be on medication in the meantime.  You shoot for a body mass index of 20-25.  When you go to the urgent care or to your primary care doctor, they should calculate your BMI.  If you don't know what it is, ask.  You can calculate your BMI with the following formula:  Weight in pounds x 703/ (height in inches) x (height in inches).  There are many good diets out there: Weight Watchers and the D.A.S.H. Diet are the best, but often, just modifying a few factors can be helpful:  Don't skip meals, don't eat out, and keeping a food diary.  I do not recommend   fad diets or diet pills which often raise blood pressure.    Everyone should get regular exercise, but this is particularly important for people with high blood pressure.  Just about any exercise is good.  The only exercise which may be harmful is lifting extreme heavy weights.  I recommend moderate exercise such as walking for 30 minutes 5 days a week.  Going to the gym for a 50 minute workout 3 times a week is also good.  This amounts to 150 minutes of exercise weekly.   Anyone with high blood pressure should avoid any use of tobacco.   Tobacco use does not elevate blood pressure, but it increases the risk of heart disease and stroke.  If you are interested in quitting, discuss with your doctor how to quit.  If you are not interested in quitting, ask yourself, "What would my life be like in 10 years if I continue to smoke?"  "How will I know when it is time to quit?"  "How would my life be better if I were to quit."   Excess alcohol intake can raise the blood pressure.  The safe alcohol intake is 2 drinks or less per day for men and 1 drink per day or less for women.   There is a very good diet which I recommend that has been designed for people with blood pressure called the D.A.S.H. Diet (dietary approaches to stop hypertension).  It consists of fruits, vegetables, lean meats, low fat dairy, whole grains, nuts and seeds.  It is very low in salt and sodium.  It has also been found to have other beneficial health effects such as lowering cholesterol and helping lose weight.  It has been developed by the National Institutes of Health and can be downloaded from the internet without any cost. Just do a web search on "D.A.S.H. Diet." or go the NIH website (www.nih.gov).  There are also cookbooks and diet plans that can be gotten from [employer] to help you with this diet. Migraine Headache A migraine headache is an intense, throbbing pain on one or both sides of your head. The exact cause of a migraine headache is not always known. A migraine may be caused when nerves in the brain become irritated and release chemicals that cause swelling within blood vessels, causing pain. Many migraine sufferers have a family history of migraines. Before you get a migraine you may or may not get an aura. An aura is a group of symptoms that can predict the beginning of a migraine. An aura may include: Visual changes such as:  Flashing lights.  Bright spots or zig-zag lines.  Tunnel vision.  Feelings of numbness.  Trouble talking.  Muscle weakness.    SYMPTOMS Pain on one or both sides of your head.  Pain that is pulsating or throbbing in nature.  Pain that is severe enough to prevent daily activities.  Pain that is aggravated by any daily physical activity.  Nausea (feeling sick to your stomach), vomiting, or both.  Pain with exposure to bright lights, loud noises, or activity.  General sensitivity to bright lights or loud noises.  MIGRAINE TRIGGERS Examples of triggers of migraine headaches include:  Alcohol.  Smoking.  Stress.  It may be related to menses (female menstruation).  Aged cheeses.  Foods or drinks that contain nitrates, glutamate, aspartame, or tyramine.  Lack of sleep.  Chocolate.  Caffeine.  Hunger.  Medications such as nitroglycerine (used to treat chest pain), birth control pills, estrogen, and   some blood pressure medications.  DIAGNOSIS  A migraine headache is often diagnosed based on: Symptoms.  Physical examination.  A computerized X-ray scan (computed tomography, CT) of your head.  TREATMENT Medications can help prevent migraines if they are recurrent or should they become recurrent. Your caregiver can help you with a medication or treatment program that will be helpful to you.  Lying down in a dark, quiet room may be helpful.  Keeping a headache diary may help you find a trend as to what may be triggering your headaches.  SEEK IMMEDIATE MEDICAL CARE IF:  You have confusion, personality changes or seizures.  You have headaches that wake you from sleep.  You have an increased frequency in your headaches.  You have a stiff neck.  You have a loss of vision.  You have muscle weakness.  You start losing your balance or have trouble walking.  You feel faint or pass out.  MAKE SURE YOU:  Understand these instructions.  Will watch your condition.  Will get help right away if you are not doing well or get worse.  Document Released: 06/10/2005 Document Revised: 05/30/2011 Document Reviewed:  01/24/2009 Exit[employer] Patient Information 2012 [employer]. 

## 2012-03-24 ENCOUNTER — Emergency Department (HOSPITAL_COMMUNITY)
Admission: EM | Admit: 2012-03-24 | Discharge: 2012-03-24 | Disposition: A | Discharge status: Home / Self Care | Payer: Medicaid Other | Source: Home / Self Care | Attending: Family Medicine | Admitting: Family Medicine

## 2012-03-24 ENCOUNTER — Encounter (HOSPITAL_COMMUNITY): Payer: Self-pay | Admitting: Emergency Medicine

## 2012-03-24 DIAGNOSIS — J020 Streptococcal pharyngitis: Secondary | ICD-10-CM

## 2012-03-24 MED ORDER — AMOXICILLIN-POT CLAVULANATE 875-125 MG PO TABS: 1.0000 | ORAL_TABLET | Freq: Two times a day (BID) | ORAL | Status: DC

## 2012-03-24 NOTE — ED Provider Notes (Signed)
History     CSN: [id number]  Arrival date & time 03/24/12  1027   First MD Initiated Contact with Patient 03/24/12 1114      Chief Complaint  Patient presents with  . Sore Throat    (Consider location/radiation/quality/duration/timing/severity/associated sxs/prior treatment) Patient is a 56 y.o. female presenting with fever. The history is provided by the patient. A language interpreter was used.  Fever Primary symptoms of the febrile illness include fever. Primary symptoms do not include cough, nausea, vomiting, diarrhea or rash. The current episode started 3 to 5 days ago. This is a new problem. The problem has been gradually worsening.    Past Medical History  Diagnosis Date  . Hypertension     No past surgical history on file.  No family history on file.  History  Substance Use Topics  . Smoking status: Never Smoker   . Smokeless tobacco: Not on file  . Alcohol Use: No    OB History    Grav Para Term Preterm Abortions TAB SAB Ect Mult Living                  Review of Systems  Constitutional: Positive for fever.  HENT: Positive for sore throat and neck pain.   Respiratory: Negative for cough.   Gastrointestinal: Negative.  Negative for nausea, vomiting and diarrhea.  Skin: Negative for rash.    Allergies  Review of patient's allergies indicates no known allergies.  Home Medications   Current Outpatient Rx  Name Route Sig Dispense Refill  . AMLODIPINE BESYLATE 5 MG PO TABS Oral Take 1 tablet (5 mg total) by mouth daily. 30 tablet 1  . AMOXICILLIN-POT CLAVULANATE 875-125 MG PO TABS Oral Take 1 tablet by mouth 2 (two) times daily. 20 tablet 0  . LISINOPRIL-HYDROCHLOROTHIAZIDE 20-12.5 MG PO TABS Oral Take 1 tablet by mouth every morning.     Marland Kitchen PROMETHAZINE HCL 25 MG PO TABS Oral Take 1 tablet (25 mg total) by mouth every 6 (six) hours as needed for nausea. 10 tablet 0    BP 170/114  Pulse 85  Temp 98.6 F (37 C) (Oral)  Resp 20  SpO2  100%  Physical Exam  Nursing note and vitals reviewed. Constitutional: She is oriented to person, place, and time. She appears well-developed and well-nourished.  HENT:  Head: Normocephalic.  Right Ear: External ear normal.  Left Ear: External ear normal.  Mouth/Throat: Mucous membranes are normal. Oropharyngeal exudate and posterior oropharyngeal erythema present.  Cardiovascular: Normal rate, regular rhythm, normal heart sounds and intact distal pulses.   Pulmonary/Chest: Breath sounds normal.  Neurological: She is alert and oriented to person, place, and time.  Skin: Skin is warm and dry.    ED Course  Procedures (including critical care time)  Labs Reviewed - No data to display No results found.   1. Streptococcal sore throat       MDM          Linna Hoff, MD 03/24/12 1222

## 2012-03-24 NOTE — ED Notes (Addendum)
Via interpreter (phone) Pt c/o sore throat x5 days... Hurts to swallow and having fevers ... She denies:, vomiting, nausea, diarrhea.

## 2012-06-06 ENCOUNTER — Emergency Department (HOSPITAL_COMMUNITY)
Admission: EM | Admit: 2012-06-06 | Discharge: 2012-06-06 | Disposition: A | Discharge status: Home / Self Care | Payer: Medicaid Other | Source: Home / Self Care | Attending: Family Medicine | Admitting: Family Medicine

## 2012-06-06 ENCOUNTER — Encounter (HOSPITAL_COMMUNITY): Payer: Self-pay | Admitting: *Deleted

## 2012-06-06 DIAGNOSIS — H6122 Impacted cerumen, left ear: Secondary | ICD-10-CM

## 2012-06-06 DIAGNOSIS — I10 Essential (primary) hypertension: Secondary | ICD-10-CM

## 2012-06-06 DIAGNOSIS — H8110 Benign paroxysmal vertigo, unspecified ear: Secondary | ICD-10-CM

## 2012-06-06 DIAGNOSIS — H6120 Impacted cerumen, unspecified ear: Secondary | ICD-10-CM

## 2012-06-06 LAB — POCT I-STAT, CHEM 8
BUN: 8 mg/dL (ref 6–23)
Calcium, Ion: 1.17 mmol/L (ref 1.12–1.23)
Chloride: 101 mEq/L (ref 96–112)
Creatinine, Ser: 0.6 mg/dL (ref 0.50–1.10)
Glucose, Bld: 163 mg/dL — ABNORMAL HIGH (ref 70–99)

## 2012-06-06 LAB — POCT URINALYSIS DIP (DEVICE)
Protein, ur: NEGATIVE mg/dL
Specific Gravity, Urine: 1.02 (ref 1.005–1.030)
Urobilinogen, UA: 0.2 mg/dL (ref 0.0–1.0)
pH: 8.5 — ABNORMAL HIGH (ref 5.0–8.0)

## 2012-06-06 MED ORDER — MECLIZINE HCL 50 MG PO TABS: 50.0000 mg | ORAL_TABLET | Freq: Two times a day (BID) | ORAL | Status: DC | PRN

## 2012-06-06 MED ORDER — LISINOPRIL-HYDROCHLOROTHIAZIDE 20-12.5 MG PO TABS: 1.0000 | ORAL_TABLET | Freq: Every morning | ORAL | Status: AC

## 2012-06-06 MED ORDER — ONDANSETRON 4 MG PO TBDP
ORAL_TABLET | ORAL | Status: AC
  Filled 2012-06-06: qty 2

## 2012-06-06 MED ORDER — AMLODIPINE BESYLATE 5 MG PO TABS: 5.0000 mg | ORAL_TABLET | Freq: Every day | ORAL | Status: AC

## 2012-06-06 MED ORDER — ONDANSETRON 4 MG PO TBDP: 4.0000 mg | ORAL_TABLET | Freq: Three times a day (TID) | ORAL | Status: DC | PRN

## 2012-06-06 MED ORDER — ONDANSETRON 4 MG PO TBDP
8.0000 mg | ORAL_TABLET | Freq: Once | ORAL | Status: AC
  Administered 2012-06-06: 8 mg via ORAL

## 2012-06-06 NOTE — ED Notes (Signed)
Per family member pt has been dizzy since 10 am - no other complaints - denies pain

## 2012-06-08 NOTE — ED Provider Notes (Signed)
History     CSN: [id number]  Arrival date & time 06/06/12  1633   First MD Initiated Contact with Patient 06/06/12 1641      Chief Complaint  Patient presents with  . Dizziness    (Consider location/radiation/quality/duration/timing/severity/associated sxs/prior treatment) HPI Comments: 56 year old female with history of hypertension. Here complaining of dizziness and feeling sick spinning around her since 10 AM this morning. Symptoms associated with nausea but no vomiting. Denies headache or abdominal pain. No diarrhea. No blurry vision. Patient currently taking only one (amlodipine) of her blood pressure medications because she ran out off lisinopril/hydrochlorothiazide and no longer able to go to [facility] after the clinic closed. Reports that she's had evidence of vertigo with nausea and vomiting in the past. Denies dysuria, fever or chills.   Past Medical History  Diagnosis Date  . Hypertension     History reviewed. No pertinent past surgical history.  History reviewed. No pertinent family history.  History  Substance Use Topics  . Smoking status: Never Smoker   . Smokeless tobacco: Not on file  . Alcohol Use: No    OB History    Grav Para Term Preterm Abortions TAB SAB Ect Mult Living                  Review of Systems  Constitutional: Negative for fever, chills, diaphoresis, appetite change and fatigue.  HENT: Negative for congestion.   Eyes: Negative for visual disturbance.  Respiratory: Negative for cough, chest tightness, shortness of breath and wheezing.   Cardiovascular: Negative for chest pain and leg swelling.  Gastrointestinal: Negative for nausea, vomiting and abdominal pain.  Neurological: Positive for dizziness. Negative for seizures, speech difficulty, weakness, numbness and headaches.    Allergies  Review of patient's allergies indicates no known allergies.  Home Medications   Current Outpatient Rx  Name  Route  Sig  Dispense  Refill  .  AMLODIPINE BESYLATE 5 MG PO TABS   Oral   Take 1 tablet (5 mg total) by mouth daily.   30 tablet   1   . LISINOPRIL-HYDROCHLOROTHIAZIDE 20-12.5 MG PO TABS   Oral   Take 1 tablet by mouth every morning.   30 tablet   1   . MECLIZINE HCL 50 MG PO TABS   Oral   Take 1 tablet (50 mg total) by mouth 2 (two) times daily as needed for dizziness or nausea.   30 tablet   0   . ONDANSETRON 4 MG PO TBDP   Oral   Take 1 tablet (4 mg total) by mouth every 8 (eight) hours as needed for nausea (or dizziness).   10 tablet   0     BP 161/95  Pulse 83  Physical Exam  Nursing note and vitals reviewed. Constitutional: She is oriented to person, place, and time. She appears well-developed and well-nourished.       Uncomfortable with positional changes  HENT:  Head: Normocephalic and atraumatic.  Right Ear: External ear normal.  Nose: Nose normal.  Mouth/Throat: Oropharynx is clear and moist. No oropharyngeal exudate.       Left ear canal with cerumen impact  Eyes: Conjunctivae normal and EOM are normal. Pupils are equal, round, and reactive to light. No scleral icterus.  Neck: Neck supple. No JVD present.  Cardiovascular: Normal rate, regular rhythm, normal heart sounds and intact distal pulses.  Exam reveals no gallop and no friction rub.   Pulmonary/Chest: Effort normal and breath sounds normal. No respiratory distress.  She has no wheezes. She has no rales. She exhibits no tenderness.  Abdominal: Soft. There is no tenderness.  Lymphadenopathy:    She has no cervical adenopathy.  Neurological: She is alert and oriented to person, place, and time.    ED Course  Procedures (including critical care time)  Labs Reviewed  POCT I-STAT, CHEM 8 - Abnormal; Notable for the following:    Glucose, Bld 163 (*)     All other components within normal limits  POCT URINALYSIS DIP (DEVICE) - Abnormal; Notable for the following:    Hgb urine dipstick TRACE (*)     pH 8.5 (*)     All other  components within normal limits  LAB REPORT - SCANNED   No results found.   1. Benign positional vertigo   2. High blood pressure   3. Left ear impacted cerumen     EKG: Normal sinus rhythm with a ventricular rate 78 beats per minutes mildly increased QT interval. No ischemic changes.  MDM   Impress peripheral vertigo likely positional; dix hallpike maneuver was not attempted as patient was no cooperative do to nausea and uncomfortable with head movement. Patient was initially treated with ondansetron 8 mg sublingual. Symptoms improved. Orthostatic vital signs were normal. Patient was able to walk without assistance to the bathroom and was tolerating fluids without vomiting prior to discharge. No ketones or signs of infection in urinalysis and electrolytes were normal. We were unable to remove left ear canal cerumen plug despite irrigation. Recommended to buy your wax cleaning kit over-the-counter and follow label instructions. Prescribed  Meclizine. Blood pressure medications where refilled. Adult clinic contact information was provided to establish primary care.   Sharin Grave, MD 06/08/12 [phone]

## 2012-08-03 ENCOUNTER — Emergency Department (HOSPITAL_COMMUNITY)
Admission: EM | Admit: 2012-08-03 | Discharge: 2012-08-03 | Disposition: A | Discharge status: Home / Self Care | Payer: Self-pay | Source: Home / Self Care

## 2012-10-08 ENCOUNTER — Other Ambulatory Visit (HOSPITAL_COMMUNITY): Payer: Self-pay | Admitting: Family Medicine

## 2012-10-08 ENCOUNTER — Ambulatory Visit (HOSPITAL_COMMUNITY)
Admission: RE | Admit: 2012-10-08 | Discharge: 2012-10-08 | Disposition: A | Discharge status: Home / Self Care | Payer: Medicaid Other | Source: Ambulatory Visit | Attending: Family Medicine | Admitting: Family Medicine

## 2012-10-08 DIAGNOSIS — I10 Essential (primary) hypertension: Secondary | ICD-10-CM | POA: Insufficient documentation

## 2012-10-08 DIAGNOSIS — R0602 Shortness of breath: Secondary | ICD-10-CM | POA: Insufficient documentation

## 2012-10-08 DIAGNOSIS — I77810 Thoracic aortic ectasia: Secondary | ICD-10-CM | POA: Insufficient documentation

## 2012-12-29 ENCOUNTER — Emergency Department (HOSPITAL_COMMUNITY)
Admission: EM | Admit: 2012-12-29 | Discharge: 2012-12-29 | Disposition: A | Discharge status: Home / Self Care | Payer: Medicaid Other | Attending: Emergency Medicine | Admitting: Emergency Medicine

## 2012-12-29 ENCOUNTER — Encounter (HOSPITAL_COMMUNITY): Payer: Self-pay | Admitting: Emergency Medicine

## 2012-12-29 DIAGNOSIS — M549 Dorsalgia, unspecified: Secondary | ICD-10-CM | POA: Insufficient documentation

## 2012-12-29 DIAGNOSIS — R109 Unspecified abdominal pain: Secondary | ICD-10-CM | POA: Insufficient documentation

## 2012-12-29 DIAGNOSIS — R112 Nausea with vomiting, unspecified: Secondary | ICD-10-CM | POA: Insufficient documentation

## 2012-12-29 DIAGNOSIS — Z79899 Other long term (current) drug therapy: Secondary | ICD-10-CM | POA: Insufficient documentation

## 2012-12-29 DIAGNOSIS — R51 Headache: Secondary | ICD-10-CM | POA: Insufficient documentation

## 2012-12-29 DIAGNOSIS — I10 Essential (primary) hypertension: Secondary | ICD-10-CM | POA: Insufficient documentation

## 2012-12-29 LAB — URINALYSIS, ROUTINE W REFLEX MICROSCOPIC
Hgb urine dipstick: NEGATIVE
Ketones, ur: NEGATIVE mg/dL
Protein, ur: NEGATIVE mg/dL
Urobilinogen, UA: 0.2 mg/dL (ref 0.0–1.0)

## 2012-12-29 LAB — CBC WITH DIFFERENTIAL/PLATELET
Basophils Absolute: 0 10*3/uL (ref 0.0–0.1)
Eosinophils Absolute: 0.1 10*3/uL (ref 0.0–0.7)
Eosinophils Relative: 2 % (ref 0–5)
HCT: 37.5 % (ref 36.0–46.0)
MCH: 28.8 pg (ref 26.0–34.0)
MCV: 83 fL (ref 78.0–100.0)
Monocytes Absolute: 0.5 10*3/uL (ref 0.1–1.0)
Platelets: 252 10*3/uL (ref 150–400)
RDW: 13 % (ref 11.5–15.5)

## 2012-12-29 LAB — BASIC METABOLIC PANEL
Calcium: 8.8 mg/dL (ref 8.4–10.5)
Creatinine, Ser: 0.55 mg/dL (ref 0.50–1.10)
GFR calc non Af Amer: >90 mL/min (ref >90)
Glucose, Bld: 137 mg/dL — ABNORMAL HIGH (ref 70–99)
Sodium: 139 mEq/L (ref 135–145)

## 2012-12-29 LAB — URINE MICROSCOPIC-ADD ON

## 2012-12-29 MED ORDER — TRAMADOL HCL 50 MG PO TABS: 50.0000 mg | ORAL_TABLET | Freq: Four times a day (QID) | ORAL | Status: DC | PRN

## 2012-12-29 MED ORDER — ONDANSETRON 4 MG PO TBDP: 4.0000 mg | ORAL_TABLET | Freq: Three times a day (TID) | ORAL | Status: DC | PRN

## 2012-12-29 MED ORDER — SODIUM CHLORIDE 0.9 % IV BOLUS (SEPSIS)
1000.0000 mL | Freq: Once | INTRAVENOUS | Status: AC
  Administered 2012-12-29: 1000 mL via INTRAVENOUS

## 2012-12-29 MED ORDER — ONDANSETRON HCL 4 MG/2ML IJ SOLN
4.0000 mg | Freq: Once | INTRAMUSCULAR | Status: AC
  Administered 2012-12-29: 4 mg via INTRAVENOUS
  Filled 2012-12-29: qty 2

## 2012-12-29 MED ORDER — KETOROLAC TROMETHAMINE 30 MG/ML IJ SOLN
30.0000 mg | Freq: Once | INTRAMUSCULAR | Status: AC
  Administered 2012-12-29: 30 mg via INTRAVENOUS
  Filled 2012-12-29: qty 1

## 2012-12-29 NOTE — ED Notes (Signed)
Patient ambulated to restroom and tolerated well.  

## 2012-12-29 NOTE — ED Provider Notes (Signed)
History    CSN: [id number] Arrival date & time 12/29/12  0718  First MD Initiated Contact with Patient 12/29/12 0725     Chief Complaint  Patient presents with  . Headache  . Flank Pain   (Consider location/radiation/quality/duration/timing/severity/associated sxs/prior Treatment) HPI Comments: Patient is a 57 year old female with a past medical history of hypertension who presents with a headache since this morning. Patient is accompanied by her son who provides the history. Patient woke up this morning with a headache located in her generalized head without radiation. The headache is throbbing and severe. Patient did not try anything for pain. She denies any head trauma or injury. Her son states she does occasionally have headaches but not like this. Patient reports associated low back pain that is located in her lumbar spine without radiation. No other associated symptoms. No aggravating/alleviating factors.  Patient is a 57 y.o. female presenting with headaches and flank pain.  Headache Associated symptoms: nausea and vomiting   Flank Pain Associated symptoms include headaches, nausea and vomiting.   Past Medical History  Diagnosis Date  . Hypertension    History reviewed. No pertinent past surgical history. No family history on file. History  Substance Use Topics  . Smoking status: Never Smoker   . Smokeless tobacco: Not on file  . Alcohol Use: No   OB History   Grav Para Term Preterm Abortions TAB SAB Ect Mult Living                 Review of Systems  Gastrointestinal: Positive for nausea and vomiting.  Genitourinary: Positive for flank pain.  Neurological: Positive for headaches.  All other systems reviewed and are negative.    Allergies  Review of patient's allergies indicates no known allergies.  Home Medications   Current Outpatient Rx  Name  Route  Sig  Dispense  Refill  . amLODipine (NORVASC) 5 MG tablet   Oral   Take 1 tablet (5 mg total) by mouth  daily.   30 tablet   1   . lisinopril-hydrochlorothiazide (PRINZIDE,ZESTORETIC) 20-12.5 MG per tablet   Oral   Take 1 tablet by mouth every morning.   30 tablet   1    BP 186/104  Pulse 87  Temp(Src) 97.3 F (36.3 C) (Oral)  Resp 22  SpO2 97% Physical Exam  Nursing note and vitals reviewed. Constitutional: She is oriented to person, place, and time. She appears well-developed and well-nourished. No distress.  HENT:  Head: Normocephalic and atraumatic.  Mouth/Throat: Oropharynx is clear and moist. No oropharyngeal exudate.  Eyes: Conjunctivae are normal.  Neck: Normal range of motion.  No meningeal signs.   Cardiovascular: Normal rate and regular rhythm.  Exam reveals no gallop and no friction rub.   No murmur heard. Pulmonary/Chest: Effort normal and breath sounds normal. She has no wheezes. She has no rales. She exhibits no tenderness.  Abdominal: Soft. She exhibits no distension. There is no tenderness. There is no rebound and no guarding.  Musculoskeletal: Normal range of motion.  Neurological: She is alert and oriented to person, place, and time. Coordination normal.  Extremity strength and sensation equal and intact bilaterally. Speech is goal-oriented. Moves limbs without ataxia.   Skin: Skin is warm and dry.  Psychiatric: She has a normal mood and affect. Her behavior is normal.    ED Course  Procedures (including critical care time) Labs Reviewed  BASIC METABOLIC PANEL - Abnormal; Notable for the following:    Potassium 3.3 (*)  Glucose, Bld 137 (*)    BUN 4 (*)    All other components within normal limits  URINALYSIS, ROUTINE W REFLEX MICROSCOPIC - Abnormal; Notable for the following:    Leukocytes, UA TRACE (*)    All other components within normal limits  URINE CULTURE  CBC WITH DIFFERENTIAL  URINE MICROSCOPIC-ADD ON   No results found.  1. Headache   2. Back pain     MDM  10:20 AM Patient feeling better after IV toradol and zofran. Patient's  labs unremarkable. Vitals stable and patient afebrile. Patient will be discharged with pain and nausea medication. Patient instructed to return with worsening or concerning symptoms. No further evaluation needed at this time.   Emilia Beck, PA-C 12/29/12 1025

## 2012-12-29 NOTE — ED Notes (Signed)
Patient speaks [place].  Her son is at bedside and speaks [place] well.   Patient states that she has a headache 10/10 and L flank pain 10/10. Patient started having N/V early this morning.

## 2012-12-29 NOTE — ED Provider Notes (Signed)
Medical screening examination/treatment/procedure(s) were performed by non-physician practitioner and as supervising physician I was immediately available for consultation/collaboration.   Omero Kowal III, MD 12/29/12 2135 

## 2012-12-30 LAB — URINE CULTURE
Colony Count: NO GROWTH
Culture: NO GROWTH

## 2013-04-08 ENCOUNTER — Emergency Department (INDEPENDENT_AMBULATORY_CARE_PROVIDER_SITE_OTHER): Payer: Medicaid Other

## 2013-04-08 ENCOUNTER — Encounter (HOSPITAL_COMMUNITY): Payer: Self-pay | Admitting: Emergency Medicine

## 2013-04-08 ENCOUNTER — Emergency Department (HOSPITAL_COMMUNITY)
Admission: EM | Admit: 2013-04-08 | Discharge: 2013-04-08 | Disposition: A | Discharge status: Home / Self Care | Payer: Medicaid Other | Source: Home / Self Care | Attending: Emergency Medicine | Admitting: Emergency Medicine

## 2013-04-08 DIAGNOSIS — R51 Headache: Secondary | ICD-10-CM

## 2013-04-08 DIAGNOSIS — R5381 Other malaise: Secondary | ICD-10-CM

## 2013-04-08 DIAGNOSIS — R9431 Abnormal electrocardiogram [ECG] [EKG]: Secondary | ICD-10-CM

## 2013-04-08 DIAGNOSIS — R5383 Other fatigue: Secondary | ICD-10-CM

## 2013-04-08 DIAGNOSIS — E876 Hypokalemia: Secondary | ICD-10-CM

## 2013-04-08 LAB — POCT URINALYSIS DIP (DEVICE)
Bilirubin Urine: NEGATIVE
Leukocytes, UA: NEGATIVE
Nitrite: NEGATIVE
Protein, ur: NEGATIVE mg/dL
Specific Gravity, Urine: 1.01 (ref 1.005–1.030)
pH: 6 (ref 5.0–8.0)

## 2013-04-08 LAB — POCT I-STAT, CHEM 8
BUN: 11 mg/dL (ref 6–23)
Calcium, Ion: 1.12 mmol/L (ref 1.12–1.23)
Creatinine, Ser: 0.8 mg/dL (ref 0.50–1.10)
Glucose, Bld: 153 mg/dL — ABNORMAL HIGH (ref 70–99)
HCT: 42 % (ref 36.0–46.0)
Hemoglobin: 14.3 g/dL (ref 12.0–15.0)
Potassium: 3.3 mEq/L — ABNORMAL LOW (ref 3.5–5.1)
Sodium: 141 mEq/L (ref 135–145)
TCO2: 27 mmol/L (ref 0–100)

## 2013-04-08 LAB — CBC WITH DIFFERENTIAL/PLATELET
Basophils Absolute: 0.1 10*3/uL (ref 0.0–0.1)
Basophils Relative: 1 % (ref 0–1)
Eosinophils Relative: 1 % (ref 0–5)
Hemoglobin: 13.3 g/dL (ref 12.0–15.0)
Lymphocytes Relative: 43 % (ref 12–46)
Lymphs Abs: 3.5 10*3/uL (ref 0.7–4.0)
MCHC: 33.3 g/dL (ref 30.0–36.0)
Neutro Abs: 4 10*3/uL (ref 1.7–7.7)
Neutrophils Relative %: 50 % (ref 43–77)
Platelets: 283 10*3/uL (ref 150–400)
RBC: 4.75 MIL/uL (ref 3.87–5.11)
RDW: 12.8 % (ref 11.5–15.5)
WBC: 8 10*3/uL (ref 4.0–10.5)

## 2013-04-08 MED ORDER — POTASSIUM CHLORIDE ER 10 MEQ PO TBCR
10.0000 meq | EXTENDED_RELEASE_TABLET | Freq: Two times a day (BID) | ORAL | Status: DC
Start: 2013-04-08 — End: 2014-03-24

## 2013-04-08 NOTE — ED Notes (Signed)
Interpretor  Phone used  By  Dr Lorenz Coaster     appt   Was  Made  With  Dr  August Saucer  On nov  13    At  [facility]

## 2013-04-08 NOTE — ED Notes (Signed)
Pt  Was  s ent  By   [facility]  Nurse  For  evaul of  Headache   And  Back pain   X    5  Days            Her  bp  Was  Low  When  Checked  By the  [facility]  Nurse             -          She  Is  Sitting  Upright on  Exam table     In no  Acute  Distress       Family   to  Interpret

## 2013-04-08 NOTE — ED Provider Notes (Signed)
Chief Complaint:   Chief Complaint  Patient presents with  . Headache    History of Present Illness:   [name] [name] is a 57 year-old [place] female. She speaks no [place], so history was obtained with the help of a telephone interpreter. She presents with a five-day history of headache, pain in her back and legs, extreme fatigue and tiredness, abdominal pain, and loose stools. She denies any fever, chills, nasal congestion, rhinorrhea, sore throat, or cough. She's had no shortness of breath or wheezing. She denies any nausea or vomiting. There's been no blood in the stools. No urinary symptoms. She has a history of diabetes, hypertension, elevated cholesterol. She is being followed at the [facility] on [facility].  Review of Systems:  Other than noted above, the patient denies any of the following symptoms. Systemic:  No fever, chills, sweats, myalgias, headache, or anorexia. Eye:  No redness, pain or drainage. ENT:  No earache, nasal congestion, rhinorrhea, sinus pressure, or sore throat. Lungs:  No cough, sputum production, wheezing, shortness of breath. No loud snoring, choking or gasping at night, or unrefreshing sleep. Cardiovascular:  No chest pain, palpitations, or syncope. GI:  No nausea, vomiting, abdominal pain or diarrhea. GU:  No dysuria, frequency, or hematuria. Skin:  No rash or pruritis. Psych:  No history of depression or anxiety.  PMFSH:  Past medical history, family history, social history, meds, and allergies were reviewed. She takes metformin, pravastatin, hydralazine, amitriptyline, ranitidine, and lisinopril/HCTZ.  Physical Exam:   Vital signs:  BP 117/84  Pulse 100  Temp(Src) 98 F (36.7 C) (Oral)  Resp 16  SpO2 98% General:  Alert, in no distress. Eye:  PERRL, full EOMs.  Lids and conjunctivas were normal. ENT:  TMs and canals were normal, without erythema or inflammation.  Nasal mucosa was clear and uncongested, without  drainage.  Mucous membranes were moist.  Pharynx was clear, without exudate or drainage.  There were no oral ulcerations or lesions. Neck:  Supple, no adenopathy, tenderness or mass. Thyroid was normal. Lungs:  No respiratory distress.  Lungs were clear to auscultation, without wheezes, rales or rhonchi.  Breath sounds were clear and equal bilaterally. Heart:  Regular rhythm, without gallops, murmers or rubs. Abdomen:  Soft, flat, and non-tender to palpation.  No hepatosplenomagaly or mass. Skin:  Clear, warm, and dry, without rash or lesions. Psych:  Normal mood and affect.  Labs:   Results for orders placed during the hospital encounter of 04/08/13  CBC WITH DIFFERENTIAL      Result Value Range   WBC 8.0  4.0 - 10.5 K/uL   RBC 4.75  3.87 - 5.11 MIL/uL   Hemoglobin 13.3  12.0 - 15.0 g/dL   HCT 16.1  09.6 - 04.5 %   MCV 84.2  78.0 - 100.0 fL   MCH 28.0  26.0 - 34.0 pg   MCHC 33.3  30.0 - 36.0 g/dL   RDW 40.9  81.1 - 91.4 %   Platelets 283  150 - 400 K/uL   Neutrophils Relative % 50  43 - 77 %   Neutro Abs 4.0  1.7 - 7.7 K/uL   Lymphocytes Relative 43  12 - 46 %   Lymphs Abs 3.5  0.7 - 4.0 K/uL   Monocytes Relative 5  3 - 12 %   Monocytes Absolute 0.4  0.1 - 1.0 K/uL   Eosinophils Relative 1  0 - 5 %   Eosinophils Absolute 0.1  0.0 -  0.7 K/uL   Basophils Relative 1  0 - 1 %   Basophils Absolute 0.1  0.0 - 0.1 K/uL  POCT I-STAT, CHEM 8      Result Value Range   Sodium 141  135 - 145 mEq/L   Potassium 3.3 (*) 3.5 - 5.1 mEq/L   Chloride 104  96 - 112 mEq/L   BUN 11  6 - 23 mg/dL   Creatinine, Ser 0.98  0.50 - 1.10 mg/dL   Glucose, Bld 119 (*) 70 - 99 mg/dL   Calcium, Ion 1.47  8.29 - 1.23 mmol/L   TCO2 27  0 - 100 mmol/L   Hemoglobin 14.3  12.0 - 15.0 g/dL   HCT 56.2  13.0 - 86.5 %  POCT URINALYSIS DIP (DEVICE)      Result Value Range   Glucose, UA NEGATIVE  NEGATIVE mg/dL   Bilirubin Urine NEGATIVE  NEGATIVE   Ketones, ur NEGATIVE  NEGATIVE mg/dL   Specific Gravity, Urine  1.010  1.005 - 1.030   Hgb urine dipstick TRACE (*) NEGATIVE   pH 6.0  5.0 - 8.0   Protein, ur NEGATIVE  NEGATIVE mg/dL   Urobilinogen, UA 0.2  0.0 - 1.0 mg/dL   Nitrite NEGATIVE  NEGATIVE   Leukocytes, UA NEGATIVE  NEGATIVE     Radiology:  Dg Abd Acute W/chest  04/08/2013   CLINICAL DATA:  Chest and abdominal pain.  EXAM: ACUTE ABDOMEN SERIES (ABDOMEN 2 VIEW & CHEST 1 VIEW)  COMPARISON:  Chest x-ray dated 10/08/2012 and abdominal radiograph dated 08/16/2011  FINDINGS: Heart size is normal. The pulmonary vascularity and interstitial markings are slightly accentuated but I think this is due to a shallow inspiration. There is no free air or free fluid in the abdomen. Normal bowel gas pattern. No abnormal abdominal calcifications. No significant osseous abnormality.  IMPRESSION: Negative abdominal radiographs.  No acute cardiopulmonary disease.   Electronically Signed   By: Geanie Cooley M.D.   On: 04/08/2013 12:03   EKG Results:  Date: 04/08/2013  Rate: 89  Rhythm: normal sinus rhythm  QRS Axis: normal  Intervals: QT prolonged  ST/T Wave abnormalities: normal  Conduction Disutrbances:none  Narrative Interpretation: Normal sinus rhythm, prolonged QT, no significant change since 06/06/2012.  Old EKG Reviewed: unchanged  Assessment:  The primary encounter diagnosis was Headache. Diagnoses of Fatigue, Hypokalemia, and Prolonged Q-T interval on ECG were also pertinent to this visit.  Nothing was found and her work up except for a low potassium. She was given a prescription for potassium replacement and should followup with her primary care practice within the next 2-3 weeks. An appointment was made for her. I also suggested she followup with a cardiologist. She states that her husband sees the same position and she would be happy to see her as well.  Plan:   1.  Meds:  The following meds were prescribed:   Discharge Medication List as of 04/08/2013 12:37 PM    START taking these medications    Details  potassium chloride (K-DUR) 10 MEQ tablet Take 1 tablet (10 mEq total) by mouth 2 (two) times daily., Starting 04/08/2013, Until Discontinued, Print        2.  Patient Education/Counseling:  The patient was given appropriate handouts, self care instructions, and instructed in symptomatic relief.   3.  Follow up:  The patient was told to follow up if no better in 3 to 4 days, if becoming worse in any way, and given some red flag symptoms  such as chest pain or shortness of breath which would prompt immediate return.  Follow up with her primary care physician.     Reuben Likes, MD 04/08/13 [phone]

## 2013-04-09 LAB — URINE CULTURE: Colony Count: 3000

## 2013-04-13 ENCOUNTER — Other Ambulatory Visit: Payer: Self-pay | Admitting: Family Medicine

## 2014-03-24 ENCOUNTER — Encounter (HOSPITAL_COMMUNITY): Payer: Self-pay | Admitting: Emergency Medicine

## 2014-03-24 ENCOUNTER — Emergency Department (HOSPITAL_COMMUNITY)
Admission: EM | Admit: 2014-03-24 | Discharge: 2014-03-25 | Disposition: A | Discharge status: Home / Self Care | Payer: Medicaid Other | Attending: Emergency Medicine | Admitting: Emergency Medicine

## 2014-03-24 ENCOUNTER — Emergency Department (HOSPITAL_COMMUNITY): Payer: Medicaid Other

## 2014-03-24 DIAGNOSIS — R100 Acute abdomen: Secondary | ICD-10-CM | POA: Diagnosis present

## 2014-03-24 DIAGNOSIS — Z79899 Other long term (current) drug therapy: Secondary | ICD-10-CM | POA: Insufficient documentation

## 2014-03-24 DIAGNOSIS — N390 Urinary tract infection, site not specified: Secondary | ICD-10-CM | POA: Diagnosis not present

## 2014-03-24 DIAGNOSIS — I10 Essential (primary) hypertension: Secondary | ICD-10-CM | POA: Insufficient documentation

## 2014-03-24 DIAGNOSIS — R1013 Epigastric pain: Secondary | ICD-10-CM

## 2014-03-24 DIAGNOSIS — R1011 Right upper quadrant pain: Secondary | ICD-10-CM | POA: Diagnosis not present

## 2014-03-24 DIAGNOSIS — R197 Diarrhea, unspecified: Secondary | ICD-10-CM | POA: Diagnosis not present

## 2014-03-24 DIAGNOSIS — E119 Type 2 diabetes mellitus without complications: Secondary | ICD-10-CM | POA: Diagnosis not present

## 2014-03-24 DIAGNOSIS — A084 Viral intestinal infection, unspecified: Secondary | ICD-10-CM | POA: Diagnosis not present

## 2014-03-24 DIAGNOSIS — R112 Nausea with vomiting, unspecified: Secondary | ICD-10-CM

## 2014-03-24 HISTORY — DX: Type 2 diabetes mellitus without complications: E11.9

## 2014-03-24 LAB — COMPREHENSIVE METABOLIC PANEL
ALK PHOS: 95 U/L (ref 39–117)
ALT: 40 U/L — ABNORMAL HIGH (ref 0–35)
AST: 41 U/L — AB (ref 0–37)
Albumin: 4.3 g/dL (ref 3.5–5.2)
Anion gap: 13 (ref 5–15)
BUN: 13 mg/dL (ref 6–23)
CALCIUM: 9.6 mg/dL (ref 8.4–10.5)
CO2: 29 mEq/L (ref 19–32)
Chloride: 96 mEq/L (ref 96–112)
Creatinine, Ser: 0.75 mg/dL (ref 0.50–1.10)
GFR calc non Af Amer: >90 mL/min (ref >90)
Glucose, Bld: 100 mg/dL — ABNORMAL HIGH (ref 70–99)
POTASSIUM: 4.1 meq/L (ref 3.7–5.3)
SODIUM: 138 meq/L (ref 137–147)
TOTAL PROTEIN: 8.3 g/dL (ref 6.0–8.3)
Total Bilirubin: 0.4 mg/dL (ref 0.3–1.2)

## 2014-03-24 LAB — CBC WITH DIFFERENTIAL/PLATELET
BASOS ABS: 0.1 10*3/uL (ref 0.0–0.1)
Basophils Relative: 1 % (ref 0–1)
Eosinophils Absolute: 0.1 10*3/uL (ref 0.0–0.7)
Eosinophils Relative: 1 % (ref 0–5)
HEMATOCRIT: 42.7 % (ref 36.0–46.0)
Hemoglobin: 14.5 g/dL (ref 12.0–15.0)
LYMPHS PCT: 58 % — AB (ref 12–46)
Lymphs Abs: 6.2 10*3/uL — ABNORMAL HIGH (ref 0.7–4.0)
MCH: 28 pg (ref 26.0–34.0)
MCHC: 34 g/dL (ref 30.0–36.0)
MCV: 82.4 fL (ref 78.0–100.0)
MONOS PCT: 5 % (ref 3–12)
Monocytes Absolute: 0.5 10*3/uL (ref 0.1–1.0)
Neutro Abs: 3.7 10*3/uL (ref 1.7–7.7)
Neutrophils Relative %: 35 % — ABNORMAL LOW (ref 43–77)
PLATELETS: 304 10*3/uL (ref 150–400)
RBC: 5.18 MIL/uL — AB (ref 3.87–5.11)
RDW: 12.6 % (ref 11.5–15.5)
WBC: 10.6 10*3/uL — AB (ref 4.0–10.5)

## 2014-03-24 LAB — LIPASE, BLOOD: Lipase: 52 U/L (ref 11–59)

## 2014-03-24 MED ORDER — MORPHINE SULFATE 4 MG/ML IJ SOLN
4.0000 mg | Freq: Once | INTRAMUSCULAR | Status: AC
  Administered 2014-03-24: 2.2 mg via INTRAVENOUS
  Filled 2014-03-24: qty 1

## 2014-03-24 MED ORDER — HYDRALAZINE HCL 20 MG/ML IJ SOLN: 5.0000 mg | Freq: Once | INTRAMUSCULAR | Status: DC

## 2014-03-24 MED ORDER — IOHEXOL 300 MG/ML  SOLN
25.0000 mL | Freq: Once | INTRAMUSCULAR | Status: AC | PRN
  Administered 2014-03-24: 25 mL via ORAL

## 2014-03-24 MED ORDER — SODIUM CHLORIDE 0.9 % IV BOLUS (SEPSIS)
1000.0000 mL | Freq: Once | INTRAVENOUS | Status: AC
  Administered 2014-03-24: 1000 mL via INTRAVENOUS

## 2014-03-24 MED ORDER — IOHEXOL 300 MG/ML  SOLN
100.0000 mL | Freq: Once | INTRAMUSCULAR | Status: AC | PRN
  Administered 2014-03-24: 100 mL via INTRAVENOUS

## 2014-03-24 MED ORDER — PROMETHAZINE HCL 25 MG/ML IJ SOLN
12.5000 mg | Freq: Once | INTRAMUSCULAR | Status: DC
  Filled 2014-03-24 (×2): qty 1

## 2014-03-24 MED ORDER — METOCLOPRAMIDE HCL 5 MG/ML IJ SOLN
10.0000 mg | Freq: Once | INTRAMUSCULAR | Status: AC
  Administered 2014-03-24: 10 mg via INTRAVENOUS
  Filled 2014-03-24: qty 2

## 2014-03-24 NOTE — ED Notes (Signed)
Language barrier: Nepali.  Son speaks english

## 2014-03-24 NOTE — ED Provider Notes (Signed)
CSN: [id number]     Arrival date & time 03/24/14  1736 History   First MD Initiated Contact with Patient 03/24/14 2038     Chief Complaint  Patient presents with  . Abdominal Pain     (Consider location/radiation/quality/duration/timing/severity/associated sxs/prior Treatment) HPI Comments: [name] [name] is a 58 y.o. female with a PMHx of HTN and DM2 who presents to the ED with complaints of 2 weeks of epigastric abd pain which has been worsening and at 10am today it became worse and she could not eat any food therefore she presented to the ED. Pain is epigastric 6/10 burning, constant, radiating to her back, worse with eating, and without known alleviating factors given that she has not tried anything. States she was previously able to tolerate food and liquids, but today her nausea became unbearable and she ate one bite of rice and could not continue. States she's able to keep fluids down and has not vomited, but states she does not want to attempt to eat. Endorses that today she had 3 episodes of loose watery stool without hematochezia or melena. States she has not passed flatus today but does not feel bloated/distended. Denies mucous or abnormal appearance to stool. Denies recent travel, sick contacts, abx use, NSAID or EtOH use. Denies fevers, chills, CP, SOB, cough, HA, vision changes, lightheadedness, dizziness, vomiting, dysuria, hematuria, abd distension, constipation, rectal pain, vaginal pain/discharge/bleeding, myalgias, arthralgias, or other concerns at this time. States she is compliant with her medications.   Chart review reveals several ED/UC visits with similar complaints, admission in 2013 for ileus vs SBO.  Patient is a 58 y.o. female presenting with abdominal pain. The history is provided by a relative. The history is limited by a language barrier. A language interpreter was used (son).  Abdominal Pain Pain location:  Epigastric Pain quality: burning   Pain radiates to:   Back Pain severity:  Moderate (6/10) Onset quality:  Gradual Duration:  2 weeks Timing:  Constant Progression:  Worsening Chronicity:  New Context: not alcohol use, not diet changes, not recent illness, not recent travel, not sick contacts and not suspicious food intake   Relieved by:  None tried Worsened by:  Eating Ineffective treatments:  None tried Associated symptoms: diarrhea (x2 episodes today, watery, nonbloody without mucous) and nausea   Associated symptoms: no anorexia, no belching, no chest pain, no chills, no constipation, no cough, no dysuria, no fever, no flatus, no hematemesis, no hematochezia, no hematuria, no melena, no shortness of breath, no sore throat, no vaginal bleeding, no vaginal discharge and no vomiting   Diarrhea:    Quality:  Watery   Number of occurrences:  2   Severity:  Mild   Duration:  1 day   Timing:  Sporadic   Progression:  Resolved Nausea:    Severity:  Moderate   Onset quality:  Gradual   Duration:  2 weeks   Timing:  Intermittent   Progression:  Unchanged Risk factors: no alcohol abuse, no aspirin use, has not had multiple surgeries, no NSAID use and no recent hospitalization     Past Medical History  Diagnosis Date  . Hypertension   . Diabetes mellitus without complication    History reviewed. No pertinent past surgical history. History reviewed. No pertinent family history. History  Substance Use Topics  . Smoking status: Never Smoker   . Smokeless tobacco: Not on file  . Alcohol Use: No   OB History   Grav Para Term Preterm Abortions TAB SAB Ect  Mult Living                 Review of Systems  Constitutional: Negative for fever and chills.  HENT: Negative for congestion and sore throat.   Respiratory: Negative for cough and shortness of breath.   Cardiovascular: Negative for chest pain.  Gastrointestinal: Positive for nausea, abdominal pain and diarrhea (x2 episodes today, watery, nonbloody without mucous). Negative for  vomiting, constipation, blood in stool, melena, hematochezia, abdominal distention, rectal pain, anorexia, flatus and hematemesis.  Genitourinary: Negative for dysuria, urgency, hematuria, flank pain, decreased urine volume, vaginal bleeding, vaginal discharge and difficulty urinating.  Musculoskeletal: Negative for arthralgias and myalgias.  Skin: Negative for color change.  Neurological: Negative for dizziness, syncope, weakness, light-headedness and headaches.  Psychiatric/Behavioral: Negative for confusion.    10 Systems reviewed and are negative for acute change except as noted in the HPI.   Allergies  Review of patient's allergies indicates no known allergies.  Home Medications   Prior to Admission medications   Medication Sig Start Date End Date Taking? Authorizing Provider  hydrALAZINE (APRESOLINE) 25 MG tablet Take 25 mg by mouth 2 (two) times daily with a meal.   Yes Historical Provider, MD  lisinopril-hydrochlorothiazide (PRINZIDE,ZESTORETIC) 20-12.5 MG per tablet Take 1 tablet by mouth every morning. 06/06/12  Yes Adlih Moreno-Coll, MD  potassium chloride (K-DUR,KLOR-CON) 10 MEQ tablet Take 10 mEq by mouth daily.   Yes Historical Provider, MD  Propylene Glycol (SYSTANE BALANCE) 0.6 % SOLN Place 1 drop into both eyes 3 (three) times daily.   Yes Historical Provider, MD  traMADol (ULTRAM) 50 MG tablet Take 1 tablet (50 mg total) by mouth every 6 (six) hours as needed for pain. 12/29/12  Yes Kaitlyn Szekalski, PA-C   BP 134/94  Pulse 91  Temp(Src) 98.6 F (37 C) (Oral)  Resp 18  SpO2 98% Physical Exam  Nursing note and vitals reviewed. Constitutional: She is oriented to person, place, and time. Vital signs are normal. She appears well-developed and well-nourished.  Non-toxic appearance. She appears distressed (uncomfortable appearing).  VSS, afebrile, nontoxic, appears uncomfortable  HENT:  Head: Normocephalic and atraumatic.  Mouth/Throat: Oropharynx is clear and moist.  Mucous membranes are dry.  Mildly dry mucous membranes  Eyes: Conjunctivae and EOM are normal. Right eye exhibits no discharge. Left eye exhibits no discharge.  Neck: Normal range of motion. Neck supple.  Cardiovascular: Normal rate, regular rhythm, normal heart sounds and intact distal pulses.  Exam reveals no gallop and no friction rub.   No murmur heard. Pulmonary/Chest: Effort normal and breath sounds normal. No respiratory distress. She has no wheezes. She has no rales.  Abdominal: Soft. Normal appearance and bowel sounds are normal. She exhibits no distension. There is tenderness in the right upper quadrant and epigastric area. There is no rigidity, no rebound, no guarding, no CVA tenderness, no tenderness at McBurney's point and negative Murphy's sign.    Obese, soft, nondistended, +BS throughout, no abnormal sounds on auscultation Exquisitely TTP in epigastric region and RUQ, neg Murphy's, no r/g/r, no CVA TTP  Musculoskeletal: Normal range of motion.  Neurological: She is alert and oriented to person, place, and time. She has normal strength. No sensory deficit.  Skin: Skin is warm, dry and intact. No rash noted.  Psychiatric: She has a normal mood and affect.    ED Course  Procedures (including critical care time) Labs Review Labs Reviewed  CBC WITH DIFFERENTIAL - Abnormal; Notable for the following:    WBC 10.6 (*)  RBC 5.18 (*)    Neutrophils Relative % 35 (*)    Lymphocytes Relative 58 (*)    Lymphs Abs 6.2 (*)    All other components within normal limits  URINALYSIS, ROUTINE W REFLEX MICROSCOPIC - Abnormal; Notable for the following:    APPearance CLOUDY (*)    Hgb urine dipstick MODERATE (*)    Protein, ur 30 (*)    Leukocytes, UA LARGE (*)    All other components within normal limits  COMPREHENSIVE METABOLIC PANEL - Abnormal; Notable for the following:    Glucose, Bld 100 (*)    AST 41 (*)    ALT 40 (*)    All other components within normal limits  URINE  MICROSCOPIC-ADD ON - Abnormal; Notable for the following:    Squamous Epithelial / LPF FEW (*)    Bacteria, UA MANY (*)    All other components within normal limits  URINE CULTURE  LIPASE, BLOOD  I-STAT TROPOININ, ED    Imaging Review Ct Abdomen Pelvis W Contrast  03/25/2014   CLINICAL DATA:  Epigastric pain since morning of 03/24/2014. Vomiting.  EXAM: CT ABDOMEN AND PELVIS WITH CONTRAST  TECHNIQUE: Multidetector CT imaging of the abdomen and pelvis was performed using the standard protocol following bolus administration of intravenous contrast.  CONTRAST:  164mL OMNIPAQUE IOHEXOL 300 MG/ML  SOLN  COMPARISON:  08/14/2011  FINDINGS: Mild dependent changes in the lung bases.  Diffuse fatty infiltration of the liver. The gallbladder, spleen, pancreas, adrenal glands, kidneys, abdominal aorta, inferior vena cava, and retroperitoneal lymph nodes are unremarkable. The stomach is decompressed. There is suggestion of a polypoid hypodense lesion in the pyloric region. This is not well evaluated due to the decompression of the stomach and could represent ingested food material. However, a polypoid lesion such as lipoma could also have this appearance and may be present. Small bowel are decompressed. Scattered stool in the colon without distention. No free air or free fluid in the abdomen.  Pelvis: Uterus and ovaries are not enlarged. No pelvic mass or lymphadenopathy. Appendix is normal. No evidence of diverticulitis. No free or loculated pelvic fluid collections benign-appearing cystic lesion in the right superior iliac bone. No destructive bone lesions appreciated. Normal alignment of the lumbar spine.  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 Diffuse fatty infiltration of the liver. No acute process demonstrated in the abdomen or pelvis.   Electronically Signed   By: Lucienne Capers M.D.   On: 03/25/2014 00:26     EKG Interpretation   Date/Time:  Thursday  March 24 2014 17:47:15 EDT Ventricular Rate:  96 PR Interval:  158 QRS Duration: 86 QT Interval:  398 QTC Calculation: 502 R Axis:   12 Text Interpretation:  Normal sinus rhythm Prolonged QT Abnormal ECG No  significant change since last tracing Confirmed by WARD,  DO, KRISTEN  [phone]) on 03/24/2014 11:29:07 PM      MDM   Final diagnoses:  Epigastric abdominal pain  Non-intractable vomiting with nausea, vomiting of unspecified type  UTI (lower urinary tract infection)  Viral gastroenteritis    57y/o female here with son who interprets for her, reports epigastric pain x2 wks which has been worsening and developed nausea today with worsening pain. Afebrile, nontoxic, with no peritoneal signs on exam. CBC w/diff showing WBC 10.6 with lymphocytic predominance, lipase WNL, CMP showing mildly elevated AST/ALT but otherwise WNL. EKG with NSR and long QT which is unchanged from prior EKGs, pt not having CP  or SOB, doubt cardiac etiology at this time. Awaiting U/A. Will proceed with CT imaging due to unclear etiology for patient's complaints. Could be gastroparesis, gastric/peptic ulcer, biliary related, viral gastritis/gastroenteritis, etc. Could have some urinary etiology/early pyelo but doubt kidney stones. No pulsatile midline masses, doubt aortic etiology, but CT will aid in evaluation. Will give fluids, 4mg  morphine, and reglan for symptoms at this time. Will reassess shortly.  11:14 PM Pt states her pain is improved after getting half dose of morphine (felt dizzy when morphine was being given, wanted to stop), nausea still lingering although no emesis. No ongoing dizziness, ambulating without difficulty. Tolerated CT contrast well. BP increased now, states she's late for her hydralazine dose. Will give 5mg  IV and recheck, possibly 5mg  more if still elevated.   12:18 AM U/A showing +leuks, few squamous, TNTC WBC, many bacteria, therefore will treat for UTI. Awaiting CT results. Nursing  reporting that her BPs had improved prior to CT, didn't give hydralazine yet, will monitor BP and eval need for meds. Phenergan not given yet due to pt being in CT.   12:42 AM CT showing possible polypoid lesion in stomach, likely lipoma, as well as fatty liver disease but no other acute process. This further suggests that the illness could be viral gastritis/gastroenteritis. Unfortunately, pt vomited when she returned from [place], nursing gave her phenergan. States she feels dizzy and that she's been feeling this way since getting morphine, although when I asked earlier she stated she was not dizzy. Endorses a feeling of vertigo. Attempted to have her perform a neuro exam and pt very drowsy still. States she has no ongoing pain, no HA or CP. Will give fluids and give phenergan time to take effect before PO challenging. I feel the morphine could have precipitated her dizziness and nausea, and is likely unrelated to her prior illness. BPs stable at 150s/80s, and did not receive hydralazine. Pt currently sleeping. Will attempt to PO challenge shortly. Will give IV rocephin for UTI, and continue tx as outpt with bactrim  1:16 AM Delayed getting 2nd bolus and IV rocephin, but pt having no ongoing vomiting, resting comfortably, PO challenged and tolerated fluids, will opt to discontinue IV abx and fluids and d/c home with bactrim for UTI, norco for pain, phenergan for nausea, and omeprazole for GI upset related to likely viral gastroenteritis. Discussed with pt foods to avoid, and f/up with GI for eval of recurrent episodes of similar complaints. Could be gastroparesis. Also needs eval for ?lipoma found on CT. Will also have pt find PCP on resource guide for ongoing management of chronic diseases, and follow up in 1-2 weeks to ensure resolution of symptoms. I explained the diagnosis and have given explicit precautions to return to the ER including for any other new or worsening symptoms. The patient understands and  accepts the medical plan as it's been dictated and I have answered their questions. Discharge instructions concerning home care and prescriptions have been given. The patient is STABLE and is discharged to home in good condition.  BP 154/93  Pulse 98  Temp(Src) 98.6 F (37 C) (Oral)  Resp 16  SpO2 96%  Meds ordered this encounter  Medications  . sodium chloride 0.9 % bolus 1,000 mL    Sig:   . morphine 4 MG/ML injection 4 mg    Sig:   . metoCLOPramide (REGLAN) injection 10 mg    Sig:   . iohexol (OMNIPAQUE) 300 MG/ML solution 25 mL    Sig:   .  iohexol (OMNIPAQUE) 300 MG/ML solution 100 mL    Sig:   . promethazine (PHENERGAN) injection 25 mg    Sig:   . omeprazole (PRILOSEC) 20 MG capsule    Sig: Take 1 capsule (20 mg total) by mouth daily.    Dispense:  30 capsule    Refill:  0    Order Specific Question:  Supervising Provider    Answer:  Noemi Chapel D [[id number]]  . HYDROcodone-acetaminophen (NORCO) 5-325 MG per tablet    Sig: Take 1 tablet by mouth every 6 (six) hours as needed for severe pain.    Dispense:  6 tablet    Refill:  0    Order Specific Question:  Supervising Provider    Answer:  Noemi Chapel D [[id number]]  . sulfamethoxazole-trimethoprim (BACTRIM DS) 800-160 MG per tablet    Sig: Take 1 tablet by mouth 2 (two) times daily.    Dispense:  14 tablet    Refill:  0    Order Specific Question:  Supervising Provider    Answer:  Noemi Chapel D [[id number]]  . promethazine (PHENERGAN) 25 MG tablet    Sig: Take 1 tablet (25 mg total) by mouth every 6 (six) hours as needed for nausea or vomiting.    Dispense:  10 tablet    Refill:  0    Order Specific Question:  Supervising Provider    Answer:  Johnna Acosta [address] [place], PA-C 03/25/14 [phone]

## 2014-03-24 NOTE — ED Notes (Signed)
Epigastric pain since 10 am.  Only nausea. X 2 episodes of diarrhea. "doesn't feel like she is bloated."

## 2014-03-24 NOTE — ED Notes (Signed)
CT notified that the pt has finished her contrast.  

## 2014-03-25 LAB — PATHOLOGIST SMEAR REVIEW

## 2014-03-25 LAB — URINALYSIS, ROUTINE W REFLEX MICROSCOPIC
Bilirubin Urine: NEGATIVE
Glucose, UA: NEGATIVE mg/dL
Ketones, ur: NEGATIVE mg/dL
NITRITE: NEGATIVE
PROTEIN: 30 mg/dL — AB
Specific Gravity, Urine: 1.01 (ref 1.005–1.030)
Urobilinogen, UA: 0.2 mg/dL (ref 0.0–1.0)
pH: 6 (ref 5.0–8.0)

## 2014-03-25 LAB — URINE MICROSCOPIC-ADD ON

## 2014-03-25 MED ORDER — PROMETHAZINE HCL 25 MG PO TABS: 25.0000 mg | ORAL_TABLET | Freq: Four times a day (QID) | ORAL | Status: DC | PRN

## 2014-03-25 MED ORDER — DEXTROSE 5 % IV SOLN: 1.0000 g | Freq: Once | INTRAVENOUS | Status: DC

## 2014-03-25 MED ORDER — SODIUM CHLORIDE 0.9 % IV BOLUS (SEPSIS): 1000.0000 mL | Freq: Once | INTRAVENOUS | Status: DC

## 2014-03-25 MED ORDER — OMEPRAZOLE 20 MG PO CPDR: 20.0000 mg | DELAYED_RELEASE_CAPSULE | Freq: Every day | ORAL | Status: AC

## 2014-03-25 MED ORDER — HYDROCODONE-ACETAMINOPHEN 5-325 MG PO TABS: 1.0000 | ORAL_TABLET | Freq: Four times a day (QID) | ORAL | Status: AC | PRN

## 2014-03-25 MED ORDER — PROMETHAZINE HCL 25 MG/ML IJ SOLN
25.0000 mg | Freq: Once | INTRAMUSCULAR | Status: AC
  Administered 2014-03-25: 25 mg via INTRAVENOUS
  Filled 2014-03-25: qty 1

## 2014-03-25 MED ORDER — SULFAMETHOXAZOLE-TMP DS 800-160 MG PO TABS: 1.0000 | ORAL_TABLET | Freq: Two times a day (BID) | ORAL | Status: AC

## 2014-03-25 NOTE — ED Notes (Signed)
Pt given water for a PO challenge.

## 2014-03-25 NOTE — Discharge Instructions (Signed)
Use phenergan as prescribed, as needed for nausea. Use prilosec as directed to help with your abdominal pain and nausea. Use tylenol or norco as needed, as directed for pain. You may use ibuprofen but always take with food, and avoid overuse as this may cause worsening gastrointestinal pain. Take all of the bactrim as directed, which will help clear your urinary tract infection. Stay well hydrated with small sips of fluids throughout the day. Follow a BRAT (banana-rice-applesauce-toast) diet as described below for the next 24-48 hours. The 'BRAT' diet is suggested, then progress to diet as tolerated as symptoms abate. Call if bloody stools, persistent diarrhea, vomiting, fever or abdominal pain. 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 Find a regular doctor using the list below. Return to ER for changing or worsening of symptoms.  Food Choices to Help Relieve Diarrhea When you have diarrhea, the foods you eat and your eating habits are very important. Choosing the right foods and drinks can help relieve diarrhea. Also, because diarrhea can last up to 7 days, you need to replace lost fluids and electrolytes (such as sodium, potassium, and chloride) in order to help prevent dehydration.  WHAT GENERAL GUIDELINES DO I NEED TO FOLLOW?  Slowly drink 1 cup (8 oz) of fluid for each episode of diarrhea. If you are getting enough fluid, your urine will be clear or pale yellow.  Eat starchy foods. Some good choices include white rice, white toast, pasta, low-fiber cereal, baked potatoes (without the skin), saltine crackers, and bagels.  Avoid large servings of any cooked vegetables.  Limit fruit to two servings per day. A serving is  cup or 1 small piece.  Choose foods with less than 2 g of fiber per serving.  Limit fats to less than 8 tsp (38 g) per day.  Avoid fried foods.  Eat  foods that have probiotics in them. Probiotics can be found in certain dairy products.  Avoid foods and beverages that may increase the speed at which food moves through the stomach and intestines (gastrointestinal tract). Things to avoid include:  High-fiber foods, such as dried fruit, raw fruits and vegetables, nuts, seeds, and whole grain foods.  Spicy foods and high-fat foods.  Foods and beverages sweetened with high-fructose corn syrup, honey, or sugar alcohols such as xylitol, sorbitol, and mannitol. WHAT FOODS ARE RECOMMENDED? Grains White rice. White, [place], or pita breads (fresh or toasted), including plain rolls, buns, or bagels. White pasta. Saltine, soda, or graham crackers. Pretzels. Low-fiber cereal. Cooked cereals made with water (such as cornmeal, farina, or cream cereals). Plain muffins. Matzo. Melba toast. Zwieback.  Vegetables Potatoes (without the skin). Strained tomato and vegetable juices. Most well-cooked and canned vegetables without seeds. Tender lettuce. Fruits Cooked or canned applesauce, apricots, cherries, fruit cocktail, grapefruit, peaches, pears, or plums. Fresh bananas, apples without skin, cherries, grapes, cantaloupe, grapefruit, peaches, oranges, or plums.  Meat and Other Protein Products Baked or boiled chicken. Eggs. Tofu. Fish. Seafood. Smooth peanut butter. Ground or well-cooked tender beef, ham, veal, lamb, pork, or poultry.  Dairy Plain yogurt, kefir, and unsweetened liquid yogurt. Lactose-free milk, buttermilk, or soy milk. Plain hard cheese. Beverages Sport drinks. Clear broths. Diluted fruit juices (except prune). Regular, caffeine-free sodas such as ginger ale. Water. Decaffeinated teas. Oral rehydration solutions. Sugar-free beverages not sweetened with sugar alcohols. Other Bouillon, broth, or soups made from recommended foods.  The items  listed above may not be a complete list of recommended foods or beverages. Contact your dietitian for more  options. WHAT FOODS ARE NOT RECOMMENDED? Grains Whole grain, whole wheat, bran, or rye breads, rolls, pastas, crackers, and cereals. Wild or brown rice. Cereals that contain more than 2 g of fiber per serving. Corn tortillas or taco shells. Cooked or dry oatmeal. Granola. Popcorn. Vegetables Raw vegetables. Cabbage, broccoli, Brussels sprouts, artichokes, baked beans, beet greens, corn, kale, legumes, peas, sweet potatoes, and yams. Potato skins. Cooked spinach and cabbage. Fruits Dried fruit, including raisins and dates. Raw fruits. Stewed or dried prunes. Fresh apples with skin, apricots, mangoes, pears, raspberries, and strawberries.  Meat and Other Protein Products Chunky peanut butter. Nuts and seeds. Beans and lentils. Berniece Salines.  Dairy High-fat cheeses. Milk, chocolate milk, and beverages made with milk, such as milk shakes. Cream. Ice cream. Sweets and Desserts Sweet rolls, doughnuts, and sweet breads. Pancakes and waffles. Fats and Oils Butter. Cream sauces. Margarine. Salad oils. Plain salad dressings. Olives. Avocados.  Beverages Caffeinated beverages (such as coffee, tea, soda, or energy drinks). Alcoholic beverages. Fruit juices with pulp. Prune juice. Soft drinks sweetened with high-fructose corn syrup or sugar alcohols. Other Coconut. Hot sauce. Chili powder. Mayonnaise. Gravy. Cream-based or milk-based soups.  The items listed above may not be a complete list of foods and beverages to avoid. Contact your dietitian for more information. WHAT SHOULD I DO IF I BECOME DEHYDRATED? Diarrhea can sometimes lead to dehydration. Signs of dehydration include dark urine and dry mouth and skin. If you think you are dehydrated, you should rehydrate with an oral rehydration solution. These solutions can be purchased at pharmacies, retail stores, or online.  Drink -1 cup (120-240 mL) of oral rehydration solution each time you have an episode of diarrhea. If drinking this amount makes your  diarrhea worse, try drinking smaller amounts more often. For example, drink 1-3 tsp (5-15 mL) every 5-10 minutes.  A general rule for staying hydrated is to drink 1-2 L of fluid per day. Talk to your health care provider about the specific amount you should be drinking each day. Drink enough fluids to keep your urine clear or pale yellow. Document Released: 08/31/2003 Document Revised: 06/15/2013 Document Reviewed: 05/03/2013 [facility] Patient Information 2015 [place], [place]. This information is not intended to replace advice given to you by your health care provider. Make sure you discuss any questions you have with your health care provider.   Abdominal Pain Many things can cause belly (abdominal) pain. Most times, the belly pain is not dangerous. Many cases of belly pain can be watched and treated at home. HOME CARE   Do not take medicines that help you go poop (laxatives) unless told to by your doctor.  Only take medicine as told by your doctor.  Eat or drink as told by your doctor. Your doctor will tell you if you should be on a special diet. GET HELP IF:  You do not know what is causing your belly pain.  You have belly pain while you are sick to your stomach (nauseous) or have runny poop (diarrhea).  You have pain while you pee or poop.  Your belly pain wakes you up at night.  You have belly pain that gets worse or better when you eat.  You have belly pain that gets worse when you eat fatty foods.  You have a fever. GET HELP RIGHT AWAY IF:   The pain does not go away within 2 hours.  You keep throwing up (vomiting).  The pain changes and is only in the right or left part of the belly.  You have bloody or tarry looking poop. MAKE SURE YOU:   Understand these instructions.  Will watch your condition.  Will get help right away if you are not doing well or get worse. Document Released: 11/27/2007 Document Revised: 06/15/2013 Document Reviewed: 02/17/2013 [facility]  Patient Information 2015 [place], [place]. This information is not intended to replace advice given to you by your health care provider. Make sure you discuss any questions you have with your health care provider.  Viral Gastroenteritis Viral gastroenteritis is also called stomach flu. This illness is caused by a certain type of germ (virus). It can cause sudden watery poop (diarrhea) and throwing up (vomiting). This can cause you to lose body fluids (dehydration). This illness usually lasts for 3 to 8 days. It usually goes away on its own. HOME CARE   Drink enough fluids to keep your pee (urine) clear or pale yellow. Drink small amounts of fluids often.  Ask your doctor how to replace body fluid losses (rehydration).  Avoid:  Foods high in sugar.  Alcohol.  Bubbly (carbonated) drinks.  Tobacco.  Juice.  Caffeine drinks.  Very hot or cold fluids.  Fatty, greasy foods.  Eating too much at one time.  Dairy products until 24 to 48 hours after your watery poop stops.  You may eat foods with active cultures (probiotics). They can be found in some yogurts and supplements.  Wash your hands well to avoid spreading the illness.  Only take medicines as told by your doctor. Do not give aspirin to children. Do not take medicines for watery poop (antidiarrheals).  Ask your doctor if you should keep taking your regular medicines.  Keep all doctor visits as told. GET HELP RIGHT AWAY IF:   You cannot keep fluids down.  You do not pee at least once every 6 to 8 hours.  You are short of breath.  You see blood in your poop or throw up. This may look like coffee grounds.  You have belly (abdominal) pain that gets worse or is just in one small spot (localized).  You keep throwing up or having watery poop.  You have a fever.  The patient is a child younger than 3 months, and he or she has a fever.  The patient is a child older than 3 months, and he or she has a fever and problems  that do not go away.  The patient is a child older than 3 months, and he or she has a fever and problems that suddenly get worse.  The patient is a baby, and he or she has no tears when crying. MAKE SURE YOU:   Understand these instructions.  Will watch your condition.  Will get help right away if you are not doing well or get worse. Document Released: 11/27/2007 Document Revised: 09/02/2011 Document Reviewed: 03/27/2011 [facility] Patient Information 2015 [place], [place]. This information is not intended to replace advice given to you by your health care provider. Make sure you discuss any questions you have with your health care provider.  Gastritis, Adult Gastritis is soreness and swelling (inflammation) of the lining of the stomach. Gastritis can develop as a sudden onset (acute) or long-term (chronic) condition. If gastritis is not treated, it can lead to stomach bleeding and ulcers. CAUSES  Gastritis occurs when the stomach lining is weak or damaged. Digestive juices from the stomach then  inflame the weakened stomach lining. The stomach lining may be weak or damaged due to viral or bacterial infections. One common bacterial infection is the Helicobacter pylori infection. Gastritis can also result from excessive alcohol consumption, taking certain medicines, or having too much acid in the stomach.  SYMPTOMS  In some cases, there are no symptoms. When symptoms are present, they may include:  Pain or a burning sensation in the upper abdomen.  Nausea.  Vomiting.  An uncomfortable feeling of fullness after eating. DIAGNOSIS  Your caregiver may suspect you have gastritis based on your symptoms and a physical exam. To determine the cause of your gastritis, your caregiver may perform the following:  Blood or stool tests to check for the H pylori bacterium.  Gastroscopy. A thin, flexible tube (endoscope) is passed down the esophagus and into the stomach. The endoscope has a light and  camera on the end. Your caregiver uses the endoscope to view the inside of the stomach.  Taking a tissue sample (biopsy) from the stomach to examine under a microscope. TREATMENT  Depending on the cause of your gastritis, medicines may be prescribed. If you have a bacterial infection, such as an H pylori infection, antibiotics may be given. If your gastritis is caused by too much acid in the stomach, H2 blockers or antacids may be given. Your caregiver may recommend that you stop taking aspirin, ibuprofen, or other nonsteroidal anti-inflammatory drugs (NSAIDs). HOME CARE INSTRUCTIONS  Only take over-the-counter or prescription medicines as directed by your caregiver.  If you were given antibiotic medicines, take them as directed. Finish them even if you start to feel better.  Drink enough fluids to keep your urine clear or pale yellow.  Avoid foods and drinks that make your symptoms worse, such as:  Caffeine or alcoholic drinks.  Chocolate.  Peppermint or mint flavorings.  Garlic and onions.  Spicy foods.  Citrus fruits, such as oranges, lemons, or limes.  Tomato-based foods such as sauce, chili, salsa, and pizza.  Fried and fatty foods.  Eat small, frequent meals instead of large meals. SEEK IMMEDIATE MEDICAL CARE IF:   You have black or dark red stools.  You vomit blood or material that looks like coffee grounds.  You are unable to keep fluids down.  Your abdominal pain gets worse.  You have a fever.  You do not feel better after 1 week.  You have any other questions or concerns. MAKE SURE YOU:  Understand these instructions.  Will watch your condition.  Will get help right away if you are not doing well or get worse. Document Released: 06/04/2001 Document Revised: 12/10/2011 Document Reviewed: 07/24/2011 [facility] Patient Information 2015 [place], [place]. This information is not intended to replace advice given to you by your health care provider. Make sure  you discuss any questions you have with your health care provider.  Nausea and Vomiting Nausea means you feel sick to your stomach. Throwing up (vomiting) is a reflex where stomach contents come out of your mouth. HOME CARE   Take medicine as told by your doctor.  Do not force yourself to eat. However, you do need to drink fluids.  If you feel like eating, eat a normal diet as told by your doctor.  Eat rice, wheat, potatoes, bread, lean meats, yogurt, fruits, and vegetables.  Avoid high-fat foods.  Drink enough fluids to keep your pee (urine) clear or pale yellow.  Ask your doctor how to replace body fluid losses (rehydrate). Signs of body fluid loss (  dehydration) include:  Feeling very thirsty.  Dry lips and mouth.  Feeling dizzy.  Dark pee.  Peeing less than normal.  Feeling confused.  Fast breathing or heart rate. GET HELP RIGHT AWAY IF:   You have blood in your throw up.  You have black or bloody poop (stool).  You have a bad headache or stiff neck.  You feel confused.  You have bad belly (abdominal) pain.  You have chest pain or trouble breathing.  You do not pee at least once every 8 hours.  You have cold, clammy skin.  You keep throwing up after 24 to 48 hours.  You have a fever. MAKE SURE YOU:   Understand these instructions.  Will watch your condition.  Will get help right away if you are not doing well or get worse. Document Released: 11/27/2007 Document Revised: 09/02/2011 Document Reviewed: 11/09/2010 [facility] Patient Information 2015 [place], [place]. This information is not intended to replace advice given to you by your health care provider. Make sure you discuss any questions you have with your health care provider.  Urinary Tract Infection Urinary tract infections (UTIs) can develop anywhere along your urinary tract. Your urinary tract is your body's drainage system for removing wastes and extra water. Your urinary tract includes two  kidneys, two ureters, a bladder, and a urethra. Your kidneys are a pair of bean-shaped organs. Each kidney is about the size of your fist. They are located below your ribs, one on each side of your spine. CAUSES Infections are caused by microbes, which are microscopic organisms, including fungi, viruses, and bacteria. These organisms are so small that they can only be seen through a microscope. Bacteria are the microbes that most commonly cause UTIs. SYMPTOMS  Symptoms of UTIs may vary by age and gender of the patient and by the location of the infection. Symptoms in young women typically include a frequent and intense urge to urinate and a painful, burning feeling in the bladder or urethra during urination. Older women and men are more likely to be tired, shaky, and weak and have muscle aches and abdominal pain. A fever may mean the infection is in your kidneys. Other symptoms of a kidney infection include pain in your back or sides below the ribs, nausea, and vomiting. DIAGNOSIS To diagnose a UTI, your caregiver will ask you about your symptoms. Your caregiver also will ask to provide a urine sample. The urine sample will be tested for bacteria and white blood cells. White blood cells are made by your body to help fight infection. TREATMENT  Typically, UTIs can be treated with medication. Because most UTIs are caused by a bacterial infection, they usually can be treated with the use of antibiotics. The choice of antibiotic and length of treatment depend on your symptoms and the type of bacteria causing your infection. HOME CARE INSTRUCTIONS  If you were prescribed antibiotics, take them exactly as your caregiver instructs you. Finish the medication even if you feel better after you have only taken some of the medication.  Drink enough water and fluids to keep your urine clear or pale yellow.  Avoid caffeine, tea, and carbonated beverages. They tend to irritate your bladder.  Empty your bladder  often. Avoid holding urine for long periods of time.  Empty your bladder before and after sexual intercourse.  After a bowel movement, women should cleanse from front to back. Use each tissue only once. SEEK MEDICAL CARE IF:   You have back pain.  You develop  a fever.  Your symptoms do not begin to resolve within 3 days. SEEK IMMEDIATE MEDICAL CARE IF:   You have severe back pain or lower abdominal pain.  You develop chills.  You have nausea or vomiting.  You have continued burning or discomfort with urination. MAKE SURE YOU:   Understand these instructions.  Will watch your condition.  Will get help right away if you are not doing well or get worse. Document Released: 03/20/2005 Document Revised: 12/10/2011 Document Reviewed: 07/19/2011 [facility] Patient Information 2015 [place], [place]. This information is not intended to replace advice given to you by your health care provider. Make sure you discuss any questions you have with your health care provider. Emergency Department Resource Guide 1) Find a Doctor and Pay Out of Pocket Although you won't have to find out who is covered by your insurance plan, it is a good idea to ask around and get recommendations. You will then need to call the office and see if the doctor you have chosen will accept you as a new patient and what types of options they offer for patients who are self-pay. Some doctors offer discounts or will set up payment plans for their patients who do not have insurance, but you will need to ask so you aren't surprised when you get to your appointment.  2) Contact Your Local Health Department Not all health departments have doctors that can see patients for sick visits, but many do, so it is worth a call to see if yours does. If you don't know where your local health department is, you can check in your phone book. The CDC also has a tool to help you locate your state's health department, and many state websites also  have listings of all of their local health departments.  3) Find a [facility] If your illness is not likely to be very severe or complicated, you may want to try a walk in clinic. These are popping up all over the country in pharmacies, drugstores, and shopping centers. They're usually staffed by nurse practitioners or physician assistants that have been trained to treat common illnesses and complaints. They're usually fairly quick and inexpensive. However, if you have serious medical issues or chronic medical problems, these are probably not your best option.  No Primary Care Doctor: - Call [facility] Connect at  [phone] - they can help you locate a primary care doctor that  accepts your insurance, provides certain services, etc. - Physician Referral Service- [phone]  Chronic Pain Problems: Organization         Address  Phone   Notes  [facility]  [phone] Patients need to be referred by their primary care doctor.   Medication Assistance: Organization         Address  Phone   Notes  [facility] Medication [facility] [address] [place], [place] [address] [phone] --Must be a resident of [facility] -- Must have NO insurance coverage whatsoever (no Medicaid/ Medicare, etc.) -- The pt. MUST have a primary care doctor that directs their care regularly and follows them in the community   [facility]  [phone]   [employer]  [phone]    Agencies that provide inexpensive medical care: Organization         Address  Phone   Notes  [place]  [phone]   [facility]    [phone]   [facility] [place], [place] [address] [phone]   [place] [address] [place] [phone]   [facility]    [phone]   [facility]    [phone]   [place] [address], [place] Phone:  [phone], Fax:  [phone] Hours of Operation:  9 am - 6 pm, M-F.  Also accepts Medicaid/Medicare and self-pay.  [facility] for [place], [address], [place] Phone: [phone], Fax: [phone]. Hours of Operation:  8:30 am - 5:30 pm, M-F.  Also accepts Medicaid and self-pay.  [facility] [address] [place] Phone: [phone]   [place], [place], [place] [phone], Ext. [phone] Mondays & Thursdays: 7-9 AM.  First 15 patients are seen on a first come, first serve basis.    [place] Providers:  Organization         Address  Phone   Notes  [facility] [address], [place] [phone] Also accepts self-pay patients.  [facility] [address], [place]  [phone]   [place], [address], [place] [phone]   [facility] [address] [place] [phone]   Lucianne Lei [address], [place]   [phone] Only accepts [place] patients after they have their name applied to their card.   Self-Pay (no insurance) in [facility]:  Organization         Address  Phone   Notes  Sickle Cell Patients, [facility] [place] [phone]   [facility] [place] [phone]   [facility] [place]  [place], [place],  [phone]   [facility]/Dr. Osei-Bonsu  [address] [place] [address], [place] [phone] Phone number for both [place] and [place] locations is the same.  [facility] [address] [place] [phone]   [facility] [address]  [place] or [address] [phone]   [facility] [address], [place] [phone], phone; [phone], fax Sees patients 1st and 3rd Saturday of every month.  Must not qualify for public or private insurance (i.e. Medicaid, Medicare, [place] Health Choice, Veterans' Benefits)  Household income should be no more than 200% of the poverty level The clinic cannot treat you if you are pregnant or think you are pregnant  Sexually transmitted diseases are not treated at the clinic.

## 2014-03-26 LAB — URINE CULTURE
COLONY COUNT: NO GROWTH
CULTURE: NO GROWTH
Special Requests: NORMAL

## 2014-03-26 NOTE — ED Provider Notes (Signed)
Medical screening examination/treatment/procedure(s) were performed by non-physician practitioner and as supervising physician I was immediately available for consultation/collaboration.   EKG Interpretation   Date/Time:  Thursday March 24 2014 17:47:15 EDT Ventricular Rate:  96 PR Interval:  158 QRS Duration: 86 QT Interval:  398 QTC Calculation: 502 R Axis:   12 Text Interpretation:  Normal sinus rhythm Prolonged QT Abnormal ECG No  significant change since last tracing Confirmed by Gailyn Crook,  DO, Murna Backer  [phone]) on 03/24/2014 11:29:07 PM        [place], DO 03/26/14 1947

## 2014-05-19 ENCOUNTER — Emergency Department (HOSPITAL_COMMUNITY)
Admission: EM | Admit: 2014-05-19 | Discharge: 2014-05-19 | Disposition: A | Discharge status: Home / Self Care | Payer: Medicaid Other | Attending: Emergency Medicine | Admitting: Emergency Medicine

## 2014-05-19 ENCOUNTER — Emergency Department (HOSPITAL_COMMUNITY): Payer: Medicaid Other

## 2014-05-19 ENCOUNTER — Encounter (HOSPITAL_COMMUNITY): Payer: Self-pay

## 2014-05-19 DIAGNOSIS — I10 Essential (primary) hypertension: Secondary | ICD-10-CM | POA: Diagnosis not present

## 2014-05-19 DIAGNOSIS — Z792 Long term (current) use of antibiotics: Secondary | ICD-10-CM | POA: Diagnosis not present

## 2014-05-19 DIAGNOSIS — R1110 Vomiting, unspecified: Secondary | ICD-10-CM | POA: Diagnosis not present

## 2014-05-19 DIAGNOSIS — R51 Headache: Secondary | ICD-10-CM | POA: Insufficient documentation

## 2014-05-19 DIAGNOSIS — R519 Headache, unspecified: Secondary | ICD-10-CM

## 2014-05-19 DIAGNOSIS — Z79899 Other long term (current) drug therapy: Secondary | ICD-10-CM | POA: Insufficient documentation

## 2014-05-19 DIAGNOSIS — E119 Type 2 diabetes mellitus without complications: Secondary | ICD-10-CM | POA: Diagnosis not present

## 2014-05-19 LAB — CBC WITH DIFFERENTIAL/PLATELET
BASOS ABS: 0.1 10*3/uL (ref 0.0–0.1)
Basophils Relative: 0 % (ref 0–1)
Eosinophils Absolute: 0.1 10*3/uL (ref 0.0–0.7)
Eosinophils Relative: 1 % (ref 0–5)
HCT: 42 % (ref 36.0–46.0)
Hemoglobin: 14.3 g/dL (ref 12.0–15.0)
LYMPHS PCT: 35 % (ref 12–46)
Lymphs Abs: 4.4 10*3/uL — ABNORMAL HIGH (ref 0.7–4.0)
MCH: 27.8 pg (ref 26.0–34.0)
MCHC: 34 g/dL (ref 30.0–36.0)
MCV: 81.7 fL (ref 78.0–100.0)
Monocytes Absolute: 0.7 10*3/uL (ref 0.1–1.0)
Monocytes Relative: 6 % (ref 3–12)
NEUTROS ABS: 7.5 10*3/uL (ref 1.7–7.7)
NEUTROS PCT: 58 % (ref 43–77)
Platelets: 300 10*3/uL (ref 150–400)
RBC: 5.14 MIL/uL — ABNORMAL HIGH (ref 3.87–5.11)
RDW: 12.6 % (ref 11.5–15.5)
WBC: 12.8 10*3/uL — AB (ref 4.0–10.5)

## 2014-05-19 LAB — I-STAT TROPONIN, ED: TROPONIN I, POC: 0 ng/mL (ref 0.00–0.08)

## 2014-05-19 LAB — COMPREHENSIVE METABOLIC PANEL
ALBUMIN: 4.3 g/dL (ref 3.5–5.2)
ALT: 48 U/L — ABNORMAL HIGH (ref 0–35)
ANION GAP: 17 — AB (ref 5–15)
AST: 45 U/L — ABNORMAL HIGH (ref 0–37)
Alkaline Phosphatase: 87 U/L (ref 39–117)
BILIRUBIN TOTAL: 0.4 mg/dL (ref 0.3–1.2)
BUN: 6 mg/dL (ref 6–23)
CHLORIDE: 92 meq/L — AB (ref 96–112)
CO2: 28 meq/L (ref 19–32)
CREATININE: 0.54 mg/dL (ref 0.50–1.10)
Calcium: 9.9 mg/dL (ref 8.4–10.5)
GLUCOSE: 109 mg/dL — AB (ref 70–99)
Potassium: 3.2 mEq/L — ABNORMAL LOW (ref 3.7–5.3)
Sodium: 137 mEq/L (ref 137–147)
Total Protein: 8.4 g/dL — ABNORMAL HIGH (ref 6.0–8.3)

## 2014-05-19 LAB — LIPASE, BLOOD: Lipase: 41 U/L (ref 11–59)

## 2014-05-19 MED ORDER — PROMETHAZINE HCL 25 MG PO TABS: 25.0000 mg | ORAL_TABLET | Freq: Three times a day (TID) | ORAL | Status: AC | PRN

## 2014-05-19 MED ORDER — LISINOPRIL 20 MG PO TABS
20.0000 mg | ORAL_TABLET | Freq: Once | ORAL | Status: AC
  Administered 2014-05-19: 20 mg via ORAL
  Filled 2014-05-19: qty 1

## 2014-05-19 MED ORDER — METOCLOPRAMIDE HCL 5 MG/ML IJ SOLN
10.0000 mg | Freq: Once | INTRAMUSCULAR | Status: AC
  Administered 2014-05-19: 10 mg via INTRAVENOUS
  Filled 2014-05-19: qty 2

## 2014-05-19 MED ORDER — DIPHENHYDRAMINE HCL 50 MG/ML IJ SOLN
25.0000 mg | Freq: Once | INTRAMUSCULAR | Status: AC
  Administered 2014-05-19: 25 mg via INTRAVENOUS
  Filled 2014-05-19: qty 1

## 2014-05-19 MED ORDER — SODIUM CHLORIDE 0.9 % IV BOLUS (SEPSIS)
1000.0000 mL | Freq: Once | INTRAVENOUS | Status: AC
  Administered 2014-05-19: 1000 mL via INTRAVENOUS

## 2014-05-19 NOTE — ED Notes (Signed)
Per [facility], pt from home with daughter for HA and HTN with N/V since 1230 pm today. Has happened in the past and given emergency medication if the BP spikes but never got it filled. Rates pain 3/10 currently. Speaks [place].

## 2014-05-19 NOTE — ED Notes (Addendum)
Pt states headache, nausea/vomiting and HTN. Family states symptoms started today. Reports 8/10 headache at the time. No neurological deficits noted. Pt is alert and oriented x4. Pt does not speak english, family at bedside to translate.

## 2014-05-19 NOTE — Discharge Instructions (Signed)

## 2014-05-19 NOTE — ED Provider Notes (Signed)
CSN: [id number]     Arrival date & time 05/19/14  1453 History   First MD Initiated Contact with Patient 05/19/14 1505     Chief Complaint  Patient presents with  . Headache  . Emesis     Patient is a 58 y.o. female presenting with headaches and vomiting. The history is provided by the patient. The history is limited by a language barrier. A language interpreter was used [place]).  Headache Associated symptoms: vomiting   Emesis Associated symptoms: headaches   Patient presents for evaluation of headache. She reports that the headache was gradual in onset and started several days ago. This morning her headache became much more intense and she developed associated dizziness and vomiting. The headache is generalized. After the vomiting began she developed some associated neck pain. She denies fevers, head injury, chest pain, abdominal pain, numbness, weakness. She has a history of similar headache once previously where she was evaluated in the emergency department and discharged. She reports history of hypertension and diabetes and was able to take her medications today.  Past Medical History  Diagnosis Date  . Hypertension   . Diabetes mellitus without complication    History reviewed. No pertinent past surgical history. No family history on file. History  Substance Use Topics  . Smoking status: Never Smoker   . Smokeless tobacco: Not on file  . Alcohol Use: No   OB History    No data available     Review of Systems  Gastrointestinal: Positive for vomiting.  Neurological: Positive for headaches.  All other systems reviewed and are negative.     Allergies  Review of patient's allergies indicates no known allergies.  Home Medications   Prior to Admission medications   Medication Sig Start Date End Date Taking? Authorizing Provider  HYDROcodone-acetaminophen (NORCO) 5-325 MG per tablet Take 1 tablet by mouth every 6 (six) hours as needed for severe pain. 03/25/14    Mercedes Strupp Camprubi-Soms, PA-C  lisinopril-hydrochlorothiazide (PRINZIDE,ZESTORETIC) 20-12.5 MG per tablet Take 1 tablet by mouth every morning. 06/06/12   Adlih Moreno-Coll, MD  omeprazole (PRILOSEC) 20 MG capsule Take 1 capsule (20 mg total) by mouth daily. 03/25/14   Mercedes Strupp Camprubi-Soms, PA-C  potassium chloride (K-DUR,KLOR-CON) 10 MEQ tablet Take 10 mEq by mouth daily.    Historical Provider, MD  promethazine (PHENERGAN) 25 MG tablet Take 1 tablet (25 mg total) by mouth every 6 (six) hours as needed for nausea or vomiting. 03/25/14   Mercedes Strupp Camprubi-Soms, PA-C  Propylene Glycol (SYSTANE BALANCE) 0.6 % SOLN Place 1 drop into both eyes 3 (three) times daily.    Historical Provider, MD  sulfamethoxazole-trimethoprim (BACTRIM DS) 800-160 MG per tablet Take 1 tablet by mouth 2 (two) times daily. 03/25/14   Mercedes Strupp Camprubi-Soms, PA-C  traMADol (ULTRAM) 50 MG tablet Take 1 tablet (50 mg total) by mouth every 6 (six) hours as needed for pain. 12/29/12   Kaitlyn Szekalski, PA-C   BP 152/102 mmHg  Pulse 104  Temp(Src) 98.7 F (37.1 C) (Oral)  Resp 18  Ht 5\' 1"  (1.549 m)  SpO2 95% Physical Exam  Constitutional: She is oriented to person, place, and time. She appears well-developed and well-nourished.  Moderate distress  HENT:  Head: Normocephalic and atraumatic.  Eyes: EOM are normal. Pupils are equal, round, and reactive to light.  photophobia  Neck: Neck supple.  Cardiovascular: Normal rate and regular rhythm.   No murmur heard. Pulmonary/Chest: Effort normal and breath sounds normal. No respiratory distress.  Abdominal: Soft. There is no tenderness. There is no rebound and no guarding.  Musculoskeletal: She exhibits no edema or tenderness.  Neurological: She is alert and oriented to person, place, and time.  Skin: Skin is warm and dry.  Psychiatric: She has a normal mood and affect. Her behavior is normal.  Nursing note and vitals reviewed.   ED Course   Procedures (including critical care time) Labs Review Labs Reviewed  COMPREHENSIVE METABOLIC PANEL - Abnormal; Notable for the following:    Potassium 3.2 (*)    Chloride 92 (*)    Glucose, Bld 109 (*)    Total Protein 8.4 (*)    AST 45 (*)    ALT 48 (*)    Anion gap 17 (*)    All other components within normal limits  CBC WITH DIFFERENTIAL - Abnormal; Notable for the following:    WBC 12.8 (*)    RBC 5.14 (*)    Lymphs Abs 4.4 (*)    All other components within normal limits  LIPASE, BLOOD  I-STAT TROPOININ, ED    Imaging Review Ct Head Wo Contrast  05/19/2014   CLINICAL DATA:  58 year old female with headache, nausea and vomiting since 12:30 p.m. today. Headache severity currently rated as a 3 out of 10.  EXAM: CT HEAD WITHOUT CONTRAST  TECHNIQUE: Contiguous axial images were obtained from the base of the skull through the vertex without intravenous contrast.  COMPARISON:  Head CT 11/27/2011.  FINDINGS: Physiologic calcification in the left basal ganglia is unchanged. There is very mild decreased attenuation in the periventricular white matter of the cerebral hemispheres bilaterally, which may reflect very mild chronic microvascular ischemic changes. No acute intracranial abnormalities. Specifically, no evidence of acute intracranial hemorrhage, no definite findings of acute/subacute cerebral ischemia, no mass, mass effect, hydrocephalus or abnormal intra or extra-axial fluid collections. Visualized paranasal sinuses and mastoids are well pneumatized. No acute displaced skull fractures are identified.  IMPRESSION: 1. No acute intracranial abnormalities. 2. Mild chronic microvascular ischemic changes in the periventricular white matter of the cerebral hemispheres bilaterally.   Electronically Signed   By: Vinnie Langton M.D.   On: 05/19/2014 16:34     EKG Interpretation   Date/Time:  Thursday May 19 2014 14:59:22 EST Ventricular Rate:  105 PR Interval:  161 QRS Duration:  96 QT Interval:  369 QTC Calculation: 488 R Axis:   -7 Text Interpretation:  Sinus tachycardia RSR' in V1 or V2, probably normal  variant Left ventricular hypertrophy Nonspecific T abnormalities, anterior  leads Borderline prolonged QT interval Confirmed by Hazle Coca [phone]) on  05/19/2014 3:59:55 PM      MDM   Final diagnoses:  Nonintractable episodic headache, unspecified headache type    Patient here with headache and vomiting. She has a history of similar headaches. Clinical picture not consistent with SAH, CVA, meningitis, hypertensive urgency. On recheck in the emergency department patient reports that her pain is resolved and she is tolerating po fluids. Patient did vomit her lisinopril this morning and she does not have any remaining pills. Providing dose of lisinopril emergency department. Discussed with family needs refill prescription in the morning, they do have refills available.    Quintella Reichert, MD 05/19/14 [phone]

## 2014-05-24 ENCOUNTER — Emergency Department (HOSPITAL_COMMUNITY): Payer: Medicaid Other

## 2014-05-24 ENCOUNTER — Emergency Department (HOSPITAL_COMMUNITY)
Admission: EM | Admit: 2014-05-24 | Discharge: 2014-05-24 | Disposition: A | Discharge status: Home / Self Care | Payer: Medicaid Other | Attending: Emergency Medicine | Admitting: Emergency Medicine

## 2014-05-24 ENCOUNTER — Encounter (HOSPITAL_COMMUNITY): Payer: Self-pay | Admitting: Family Medicine

## 2014-05-24 DIAGNOSIS — I10 Essential (primary) hypertension: Secondary | ICD-10-CM | POA: Diagnosis not present

## 2014-05-24 DIAGNOSIS — Z79899 Other long term (current) drug therapy: Secondary | ICD-10-CM | POA: Insufficient documentation

## 2014-05-24 DIAGNOSIS — R1110 Vomiting, unspecified: Secondary | ICD-10-CM | POA: Diagnosis not present

## 2014-05-24 DIAGNOSIS — E119 Type 2 diabetes mellitus without complications: Secondary | ICD-10-CM | POA: Insufficient documentation

## 2014-05-24 LAB — BASIC METABOLIC PANEL
Anion gap: 15 (ref 5–15)
BUN: 10 mg/dL (ref 6–23)
CO2: 27 meq/L (ref 19–32)
CREATININE: 0.61 mg/dL (ref 0.50–1.10)
Calcium: 9.2 mg/dL (ref 8.4–10.5)
Chloride: 98 mEq/L (ref 96–112)
GFR calc Af Amer: >90 mL/min (ref >90)
Glucose, Bld: 139 mg/dL — ABNORMAL HIGH (ref 70–99)
Potassium: 3.2 mEq/L — ABNORMAL LOW (ref 3.7–5.3)
Sodium: 140 mEq/L (ref 137–147)

## 2014-05-24 LAB — CBC
HEMATOCRIT: 38.7 % (ref 36.0–46.0)
Hemoglobin: 12.7 g/dL (ref 12.0–15.0)
MCH: 27.2 pg (ref 26.0–34.0)
MCHC: 32.8 g/dL (ref 30.0–36.0)
MCV: 82.9 fL (ref 78.0–100.0)
PLATELETS: 282 10*3/uL (ref 150–400)
RBC: 4.67 MIL/uL (ref 3.87–5.11)
RDW: 13 % (ref 11.5–15.5)
WBC: 9.7 10*3/uL (ref 4.0–10.5)

## 2014-05-24 LAB — I-STAT TROPONIN, ED: Troponin i, poc: 0 ng/mL (ref 0.00–0.08)

## 2014-05-24 MED ORDER — SODIUM CHLORIDE 0.9 % IV BOLUS (SEPSIS)
1000.0000 mL | Freq: Once | INTRAVENOUS | Status: AC
  Administered 2014-05-24: 1000 mL via INTRAVENOUS

## 2014-05-24 NOTE — ED Notes (Signed)
Pt presents from [facility] with c/o hypertension. Pt given 0.2 Clonidine and 100mg  PO labetolol at 1748 with no relief of BP at the Clinic.  Pt was brought by ambulance for further evaluation. PT also c/o headache to posterior head.

## 2014-05-24 NOTE — ED Notes (Signed)
Pt a/o x 4 on d/c in wheelchair with family. 

## 2014-05-24 NOTE — ED Provider Notes (Signed)
CSN: [id number]     Arrival date & time 05/24/14  1825 History   First MD Initiated Contact with Patient 05/24/14 1851     Chief Complaint  Patient presents with  . Hypertension     (Consider location/radiation/quality/duration/timing/severity/associated sxs/prior Treatment) The history is provided by the patient.     This is a 58 year old [employer] use female sent from [facility] for hypertension. There is a language.. Her son speaks [place] and provides translation. The patient has a past medical history of hypertension and diabetes. She is currently taking labetalol, metoprolol, and has a prescription of promethazine for vomiting. However, she has been taking promethazine nightly. Her son states she is also very anxious. Today at the local health and wellness Center. She was found to be hypertensive to 170/128. She was given clonidine 0.2 mg and labetalol prior to arrival at the emergency department. Upon my examination. Patient's blood pressure is at 111/72. She is also complaining of some shortness of breath, headache and chest pain at the health and wellness center. However, she currently has none of these symptoms. The patient states that she takes her medications daily as directed. She has not missed any doses.  Past Medical History  Diagnosis Date  . Hypertension   . Diabetes mellitus without complication    History reviewed. No pertinent past surgical history. No family history on file. History  Substance Use Topics  . Smoking status: Never Smoker   . Smokeless tobacco: Not on file  . Alcohol Use: No   OB History    No data available     Review of Systems  Ten systems reviewed and are negative for acute change, except as noted in the HPI.    Allergies  Review of patient's allergies indicates no known allergies.  Home Medications   Prior to Admission medications   Medication Sig Start Date End Date Taking? Authorizing Provider   HYDROcodone-acetaminophen (NORCO) 5-325 MG per tablet Take 1 tablet by mouth every 6 (six) hours as needed for severe pain. 03/25/14  Yes Mercedes Strupp Camprubi-Soms, PA-C  lisinopril-hydrochlorothiazide (PRINZIDE,ZESTORETIC) 20-25 MG per tablet Take 1 tablet by mouth daily.   Yes Historical Provider, MD  metFORMIN (GLUCOPHAGE-XR) 500 MG 24 hr tablet Take 500 mg by mouth daily with breakfast.  05/13/14  Yes Historical Provider, MD  omeprazole (PRILOSEC) 20 MG capsule Take 1 capsule (20 mg total) by mouth daily. 03/25/14  Yes Mercedes Strupp Camprubi-Soms, PA-C  promethazine (PHENERGAN) 25 MG tablet Take 1 tablet (25 mg total) by mouth every 8 (eight) hours as needed for nausea or vomiting. 05/19/14  Yes Quintella Reichert, MD  lisinopril-hydrochlorothiazide (PRINZIDE,ZESTORETIC) 20-12.5 MG per tablet Take 1 tablet by mouth every morning. Patient not taking: Reported on 05/24/2014 06/06/12   Leonette Monarch Moreno-Coll, MD  sulfamethoxazole-trimethoprim (BACTRIM DS) 800-160 MG per tablet Take 1 tablet by mouth 2 (two) times daily. Patient not taking: Reported on 05/24/2014 03/25/14   Patty Sermons Camprubi-Soms, PA-C   BP 86/72 mmHg  Pulse 92  Temp(Src) 98.3 F (36.8 C) (Oral)  Resp 19  SpO2 94% Physical Exam  Constitutional: She is oriented to person, place, and time. She appears well-developed and well-nourished. No distress.  HENT:  Head: Normocephalic and atraumatic.  Eyes: Conjunctivae are normal. No scleral icterus.  Neck: Normal range of motion.  Cardiovascular: Normal rate, regular rhythm and normal heart sounds.  Exam reveals no gallop and no friction rub.   No murmur heard. Pulmonary/Chest: Effort normal and breath sounds normal.  No respiratory distress.  Abdominal: Soft. Bowel sounds are normal. She exhibits no distension and no mass. There is no tenderness. There is no guarding.  Neurological: She is alert and oriented to person, place, and time.  Skin: Skin is warm and dry. She is not  diaphoretic.    ED Course  Procedures (including critical care time) Labs Review Labs Reviewed  BASIC METABOLIC PANEL  [place], ED    Imaging Review No results found.   EKG Interpretation   Date/Time:  Tuesday May 24 2014 18:28:23 EST Ventricular Rate:  106 PR Interval:  159 QRS Duration: 95 QT Interval:  360 QTC Calculation: 478 R Axis:   -11 Text Interpretation:  Sinus tachycardia Nonspecific T abnormalities,  anterior leads Abnormal ekg No significant change since last tracing  Confirmed by [place] ([id number]) on 05/24/2014 6:34:01 PM      MDM   Final diagnoses:  None   Patient without hypertension. She is feeling much better than her initial encounter at the [facility]. Her BP dropped but has stabilized. She will be discharged home to follow up with her pcp. She should take her medications as directed. Her potasium is somewhat low and she should replete with gatorade ekg and cxr is negative and negative troponin.     Margarita Mail, PA-C 05/25/14 2001  Sharyon Cable, MD 05/27/14 [phone]

## 2014-05-24 NOTE — Discharge Instructions (Signed)
Please continue to take your medications as directed.  Follow up your primary care physician.  Hypertension Hypertension, commonly called high blood pressure, is when the force of blood pumping through your arteries is too strong. Your arteries are the blood vessels that carry blood from your heart throughout your body. A blood pressure reading consists of a higher number over a lower number, such as 110/72. The higher number (systolic) is the pressure inside your arteries when your heart pumps. The lower number (diastolic) is the pressure inside your arteries when your heart relaxes. Ideally you want your blood pressure below 120/80. Hypertension forces your heart to work harder to pump blood. Your arteries may become narrow or stiff. Having hypertension puts you at risk for heart disease, stroke, and other problems.  RISK FACTORS Some risk factors for high blood pressure are controllable. Others are not.  Risk factors you cannot control include:   Race. You may be at higher risk if you are African American.  Age. Risk increases with age.  Gender. Men are at higher risk than women before age 21 years. After age 72, women are at higher risk than men. Risk factors you can control include:  Not getting enough exercise or physical activity.  Being overweight.  Getting too much fat, sugar, calories, or salt in your diet.  Drinking too much alcohol. SIGNS AND SYMPTOMS Hypertension does not usually cause signs or symptoms. Extremely high blood pressure (hypertensive crisis) may cause headache, anxiety, shortness of breath, and nosebleed. DIAGNOSIS  To check if you have hypertension, your health care provider will measure your blood pressure while you are seated, with your arm held at the level of your heart. It should be measured at least twice using the same arm. Certain conditions can cause a difference in blood pressure between your right and left arms. A blood pressure reading that is higher  than normal on one occasion does not mean that you need treatment. If one blood pressure reading is high, ask your health care provider about having it checked again. TREATMENT  Treating high blood pressure includes making lifestyle changes and possibly taking medicine. Living a healthy lifestyle can help lower high blood pressure. You may need to change some of your habits. Lifestyle changes may include:  Following the DASH diet. This diet is high in fruits, vegetables, and whole grains. It is low in salt, red meat, and added sugars.  Getting at least 2 hours of brisk physical activity every week.  Losing weight if necessary.  Not smoking.  Limiting alcoholic beverages.  Learning ways to reduce stress. If lifestyle changes are not enough to get your blood pressure under control, your health care provider may prescribe medicine. You may need to take more than one. Work closely with your health care provider to understand the risks and benefits. HOME CARE INSTRUCTIONS  Have your blood pressure rechecked as directed by your health care provider.   Take medicines only as directed by your health care provider. Follow the directions carefully. Blood pressure medicines must be taken as prescribed. The medicine does not work as well when you skip doses. Skipping doses also puts you at risk for problems.   Do not smoke.   Monitor your blood pressure at home as directed by your health care provider. SEEK MEDICAL CARE IF:   You think you are having a reaction to medicines taken.  You have recurrent headaches or feel dizzy.  You have swelling in your ankles.  You have trouble  with your vision. SEEK IMMEDIATE MEDICAL CARE IF:  You develop a severe headache or confusion.  You have unusual weakness, numbness, or feel faint.  You have severe chest or abdominal pain.  You vomit repeatedly.  You have trouble breathing. MAKE SURE YOU:   Understand these instructions.  Will watch  your condition.  Will get help right away if you are not doing well or get worse. Document Released: 06/10/2005 Document Revised: 10/25/2013 Document Reviewed: 04/02/2013 [facility] Patient Information 2015 [place], [place]. This information is not intended to replace advice given to you by your health care provider. Make sure you discuss any questions you have with your health care provider.

## 2015-01-02 ENCOUNTER — Ambulatory Visit (HOSPITAL_COMMUNITY)
Admission: RE | Admit: 2015-01-02 | Discharge: 2015-01-02 | Disposition: A | Discharge status: Home / Self Care | Payer: Medicaid Other | Source: Ambulatory Visit | Attending: Internal Medicine | Admitting: Internal Medicine

## 2015-01-02 ENCOUNTER — Other Ambulatory Visit (HOSPITAL_COMMUNITY): Payer: Self-pay | Admitting: Internal Medicine

## 2015-01-02 DIAGNOSIS — R52 Pain, unspecified: Secondary | ICD-10-CM

## 2015-01-02 DIAGNOSIS — M79671 Pain in right foot: Secondary | ICD-10-CM | POA: Insufficient documentation

## 2016-07-06 IMAGING — CT CT ABD-PELV W/ CM
2 of 5 series · 15 of 46 positions shown, 17 images · IV contrast (omnipaque)
Comparison: 08/14/2011

CLINICAL DATA: Epigastric pain since morning of 03/24/2014.
Vomiting.

EXAM:
CT ABDOMEN AND PELVIS WITH CONTRAST
TECHNIQUE: Multidetector CT imaging of the abdomen and pelvis was performed
using the standard protocol following bolus administration of
intravenous contrast.
CONTRAST:  100mL OMNIPAQUE IOHEXOL 300 MG/ML  SOLN

[Series 2: abd/ pelvis 5.0 i30f 1 · axial · 0.82mm/px · z∈[+859,+1249]mm · 12 of 88 slices shown, 14 images]
[im 5/88  soft-tissue]
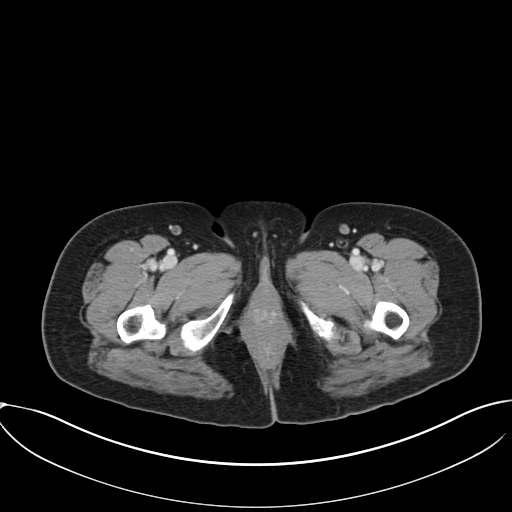
[im 5/88  bone]
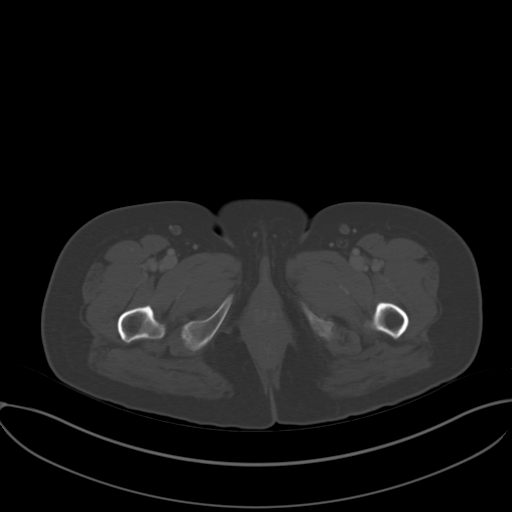
[im 14/88  soft-tissue]
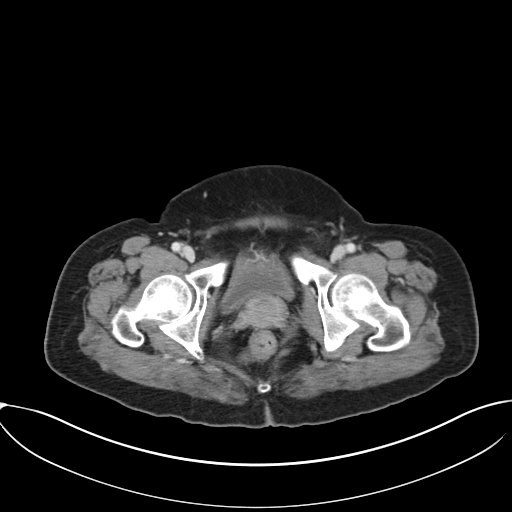
[im 18/88  soft-tissue]
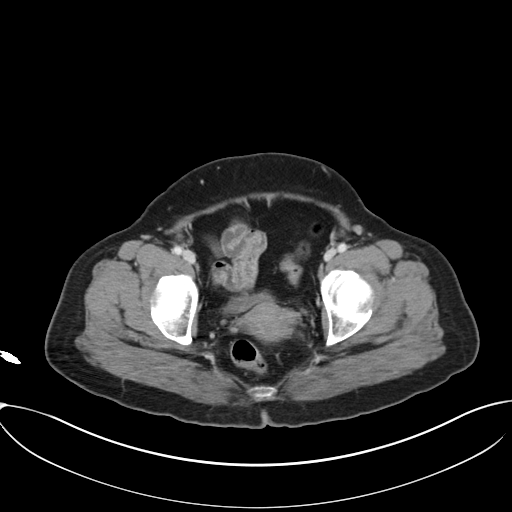
[im 27/88  soft-tissue]
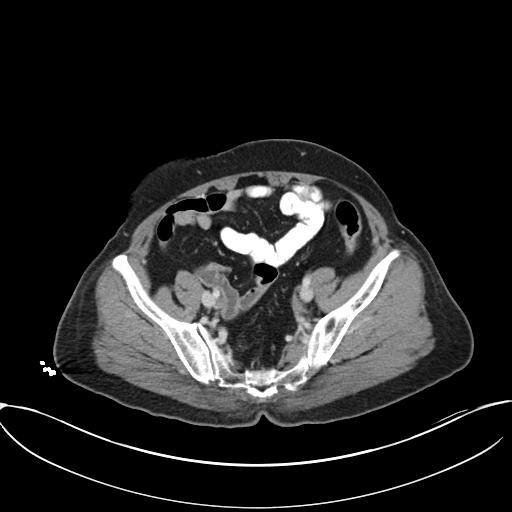
[im 35/88  soft-tissue]
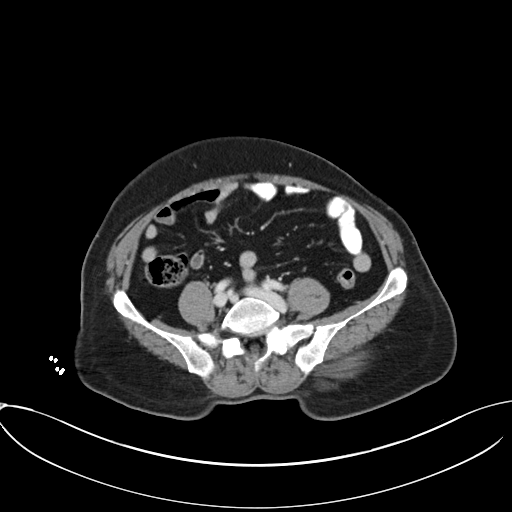
[im 40/88  soft-tissue]
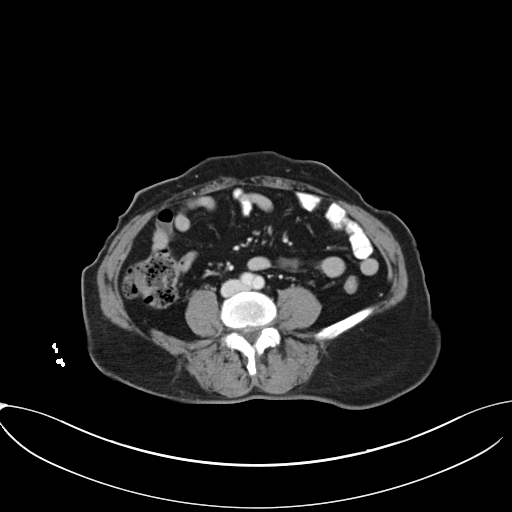
[im 48/88  soft-tissue]
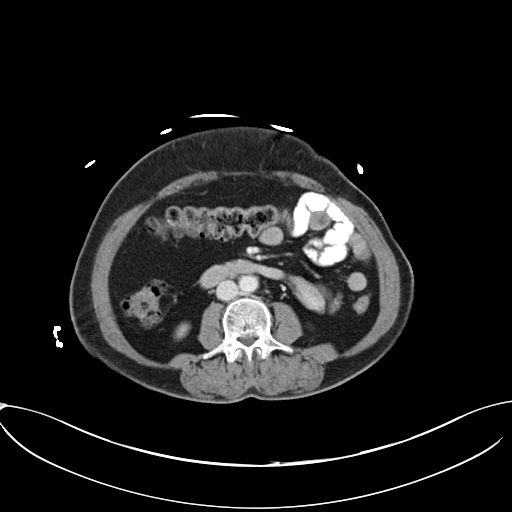
[im 53/88  soft-tissue]
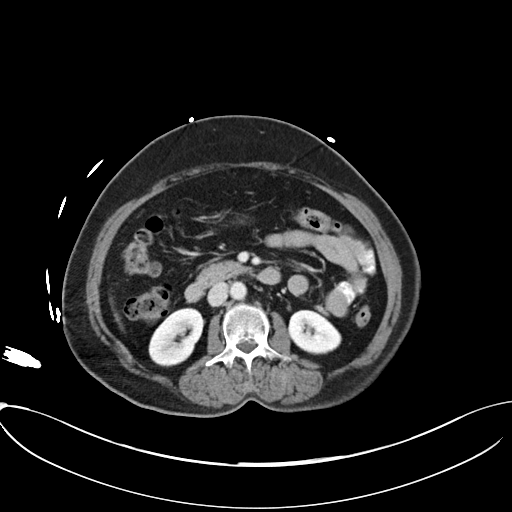
[im 61/88  soft-tissue]
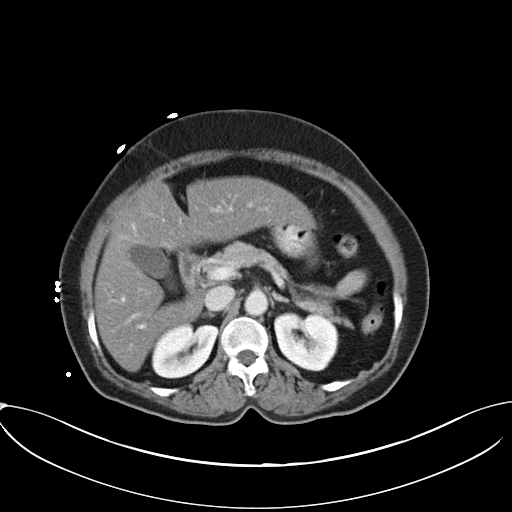
[im 61/88  bone]
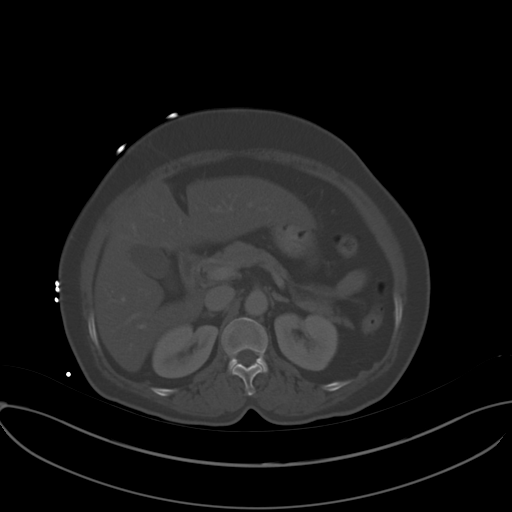
[im 70/88  soft-tissue]
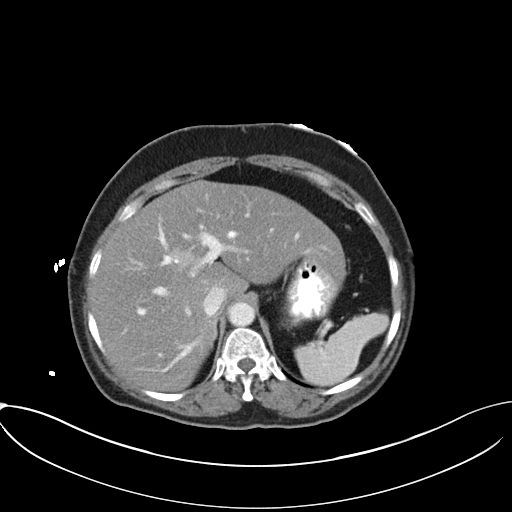
[im 74/88  soft-tissue]
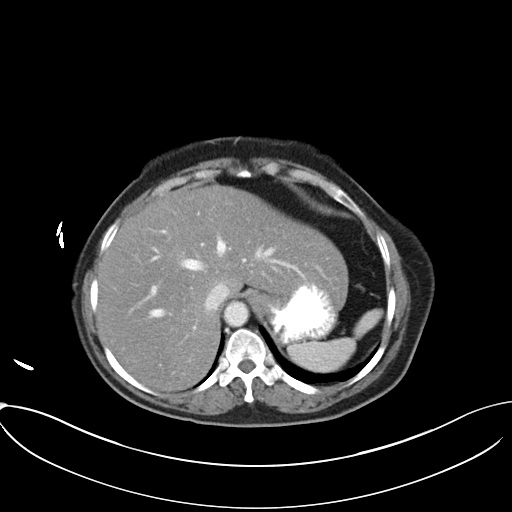
[im 83/88  soft-tissue]
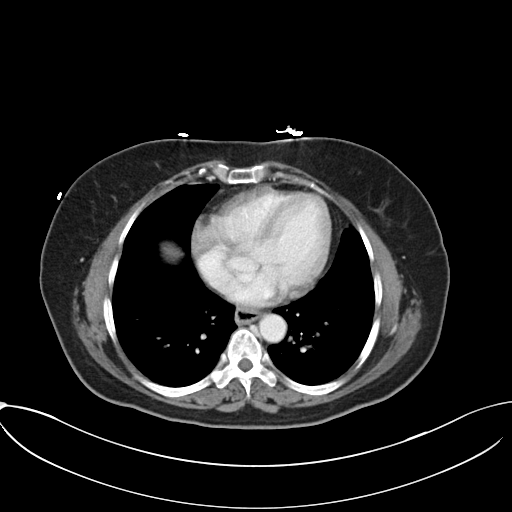

[Series 5: coronals · coronal · 0.62mm/px · 3 of 130 slices shown]
[im 44/130  soft-tissue]
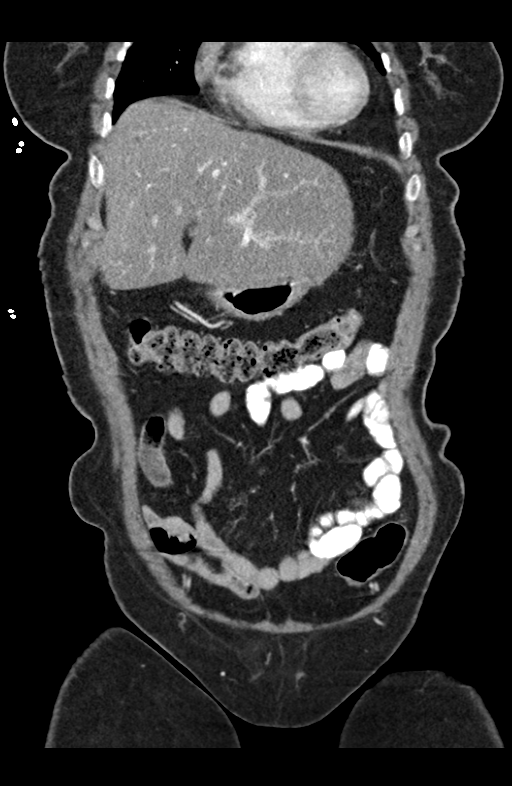
[im 58/130  soft-tissue]
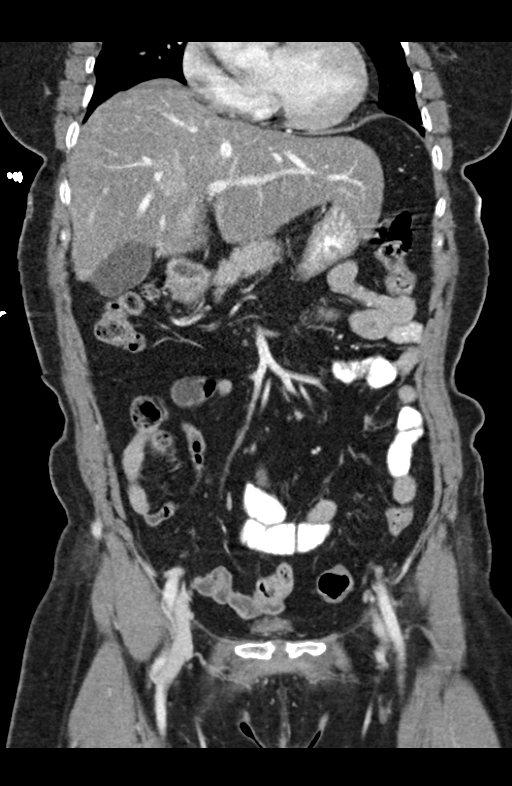
[im 72/130  soft-tissue]
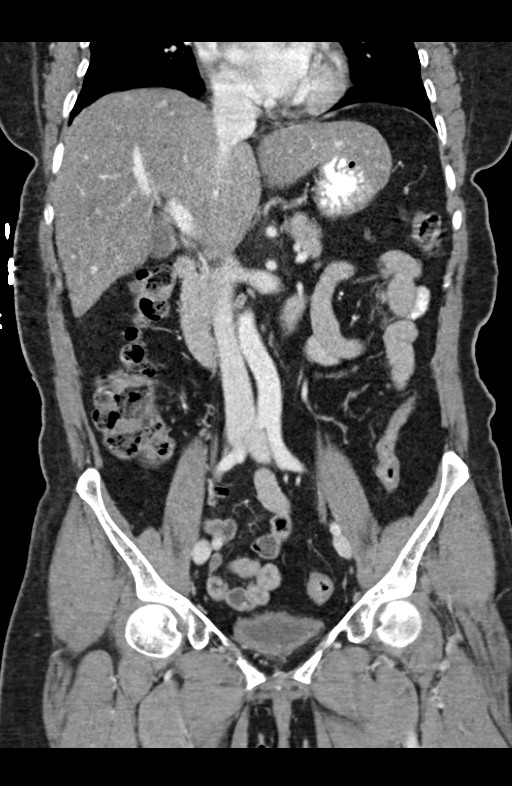

[15 of 46 positions shown; findings below may reference images not displayed]

FINDINGS: Mild dependent changes in the lung bases.

Diffuse fatty infiltration of the liver. The gallbladder, spleen,
pancreas, adrenal glands, kidneys, abdominal aorta, inferior vena
cava, and retroperitoneal lymph nodes are unremarkable. The stomach
is decompressed. There is suggestion of a polypoid hypodense lesion
in the pyloric region. This is not well evaluated due to the
decompression of the stomach and could represent ingested food
material. However, a polypoid lesion such as lipoma could also have
this appearance and may be present. Small bowel are decompressed.
Scattered stool in the colon without distention. No free air or free
fluid in the abdomen.

Pelvis: Uterus and ovaries are not enlarged. No pelvic mass or
lymphadenopathy. Appendix is normal. No evidence of diverticulitis.
No free or loculated pelvic fluid collections benign-appearing
cystic lesion in the right superior iliac bone. No destructive bone
lesions appreciated. Normal alignment of the lumbar spine.
IMPRESSION: Possible polypoid lesion in the stomach suggesting lipoma as
discussed. Diffuse fatty infiltration of the liver. No acute process
demonstrated in the abdomen or pelvis.
